# Patient Record
Sex: Female | Born: 1970
Health system: Southern US, Community
[De-identification: ages and names within clinical notes are randomized; demographics above are authoritative.]

## PROBLEM LIST (undated history)

## (undated) DIAGNOSIS — I639 Cerebral infarction, unspecified: Secondary | ICD-10-CM

## (undated) DIAGNOSIS — D259 Leiomyoma of uterus, unspecified: Secondary | ICD-10-CM

## (undated) DIAGNOSIS — I219 Acute myocardial infarction, unspecified: Secondary | ICD-10-CM

## (undated) DIAGNOSIS — I10 Essential (primary) hypertension: Secondary | ICD-10-CM

## (undated) DIAGNOSIS — S83207A Unspecified tear of unspecified meniscus, current injury, left knee, initial encounter: Secondary | ICD-10-CM

## (undated) DIAGNOSIS — Z789 Other specified health status: Secondary | ICD-10-CM

## (undated) DIAGNOSIS — R5381 Other malaise: Secondary | ICD-10-CM

## (undated) DIAGNOSIS — N329 Bladder disorder, unspecified: Secondary | ICD-10-CM

## (undated) DIAGNOSIS — F32A Depression, unspecified: Secondary | ICD-10-CM

## (undated) DIAGNOSIS — K5900 Constipation, unspecified: Secondary | ICD-10-CM

## (undated) DIAGNOSIS — E7800 Pure hypercholesterolemia, unspecified: Secondary | ICD-10-CM

## (undated) DIAGNOSIS — Z9889 Other specified postprocedural states: Secondary | ICD-10-CM

## (undated) DIAGNOSIS — J45909 Unspecified asthma, uncomplicated: Secondary | ICD-10-CM

## (undated) DIAGNOSIS — E119 Type 2 diabetes mellitus without complications: Secondary | ICD-10-CM

## (undated) DIAGNOSIS — N189 Chronic kidney disease, unspecified: Secondary | ICD-10-CM

## (undated) DIAGNOSIS — R519 Headache, unspecified: Secondary | ICD-10-CM

## (undated) DIAGNOSIS — K8020 Calculus of gallbladder without cholecystitis without obstruction: Secondary | ICD-10-CM

## (undated) HISTORY — PX: DILATION AND CURETTAGE OF UTERUS: SHX78

## (undated) HISTORY — PX: CHOLECYSTECTOMY: SHX55

## (undated) HISTORY — PX: TUBAL LIGATION: SHX77

## (undated) HISTORY — DX: Unspecified asthma, uncomplicated: J45.909

## (undated) HISTORY — PX: MYOMECTOMY: SHX85

## (undated) HISTORY — PX: ECTOPIC PREGNANCY SURGERY: SHX613

---
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
## 1998-08-27 HISTORY — PX: FOOT SURGERY: SHX648

## 1999-08-28 HISTORY — PX: ECTOPIC PREGNANCY SURGERY: SHX613

## 2010-08-03 ENCOUNTER — Inpatient Hospital Stay (HOSPITAL_COMMUNITY)
Admission: AD | Admit: 2010-08-03 | Discharge: 2010-08-03 | Payer: Self-pay | Source: Home / Self Care | Admitting: Obstetrics and Gynecology

## 2010-11-07 LAB — URINALYSIS, ROUTINE W REFLEX MICROSCOPIC
Bilirubin Urine: NEGATIVE
Glucose, UA: NEGATIVE mg/dL
Ketones, ur: NEGATIVE mg/dL
Leukocytes, UA: NEGATIVE
Nitrite: POSITIVE — AB
Protein, ur: NEGATIVE mg/dL

## 2010-11-07 LAB — URINE CULTURE

## 2010-11-07 LAB — DIFFERENTIAL
Eosinophils Relative: 3 % (ref 0–5)
Monocytes Absolute: 0.6 10*3/uL (ref 0.1–1.0)
Neutrophils Relative %: 52 % (ref 43–77)

## 2010-11-07 LAB — CBC
HCT: 37.3 % (ref 36.0–46.0)
MCH: 27.8 pg (ref 26.0–34.0)
MCHC: 33.3 g/dL (ref 30.0–36.0)
MCV: 83.6 fL (ref 78.0–100.0)
Platelets: 279 10*3/uL (ref 150–400)

## 2010-11-07 LAB — URINE MICROSCOPIC-ADD ON

## 2010-11-07 LAB — WET PREP, GENITAL
Clue Cells Wet Prep HPF POC: NONE SEEN
Yeast Wet Prep HPF POC: NONE SEEN

## 2011-01-15 ENCOUNTER — Emergency Department (HOSPITAL_COMMUNITY)
Admission: EM | Admit: 2011-01-15 | Discharge: 2011-01-15 | Discharge status: Against Medical Advice | Payer: Self-pay | Attending: Emergency Medicine | Admitting: Emergency Medicine

## 2011-01-15 DIAGNOSIS — S90569A Insect bite (nonvenomous), unspecified ankle, initial encounter: Secondary | ICD-10-CM | POA: Insufficient documentation

## 2011-01-15 DIAGNOSIS — W57XXXA Bitten or stung by nonvenomous insect and other nonvenomous arthropods, initial encounter: Secondary | ICD-10-CM | POA: Insufficient documentation

## 2011-01-15 DIAGNOSIS — M79609 Pain in unspecified limb: Secondary | ICD-10-CM | POA: Insufficient documentation

## 2011-04-04 ENCOUNTER — Inpatient Hospital Stay (INDEPENDENT_AMBULATORY_CARE_PROVIDER_SITE_OTHER)
Admission: RE | Admit: 2011-04-04 | Discharge: 2011-04-04 | Disposition: A | Discharge status: Home / Self Care | Payer: Self-pay | Source: Ambulatory Visit | Attending: Family Medicine | Admitting: Family Medicine

## 2011-04-04 DIAGNOSIS — T6391XA Toxic effect of contact with unspecified venomous animal, accidental (unintentional), initial encounter: Secondary | ICD-10-CM

## 2012-09-14 ENCOUNTER — Emergency Department (HOSPITAL_COMMUNITY): Payer: Self-pay

## 2012-09-14 ENCOUNTER — Emergency Department (HOSPITAL_COMMUNITY)
Admission: EM | Admit: 2012-09-14 | Discharge: 2012-09-15 | Disposition: A | Discharge status: Home / Self Care | Payer: Self-pay | Attending: Emergency Medicine | Admitting: Emergency Medicine

## 2012-09-14 ENCOUNTER — Encounter (HOSPITAL_COMMUNITY): Payer: Self-pay | Admitting: *Deleted

## 2012-09-14 DIAGNOSIS — R069 Unspecified abnormalities of breathing: Secondary | ICD-10-CM | POA: Insufficient documentation

## 2012-09-14 DIAGNOSIS — R0989 Other specified symptoms and signs involving the circulatory and respiratory systems: Secondary | ICD-10-CM | POA: Insufficient documentation

## 2012-09-14 DIAGNOSIS — R05 Cough: Secondary | ICD-10-CM | POA: Insufficient documentation

## 2012-09-14 DIAGNOSIS — R197 Diarrhea, unspecified: Secondary | ICD-10-CM | POA: Insufficient documentation

## 2012-09-14 DIAGNOSIS — R6889 Other general symptoms and signs: Secondary | ICD-10-CM

## 2012-09-14 DIAGNOSIS — J029 Acute pharyngitis, unspecified: Secondary | ICD-10-CM | POA: Insufficient documentation

## 2012-09-14 DIAGNOSIS — IMO0001 Reserved for inherently not codable concepts without codable children: Secondary | ICD-10-CM | POA: Insufficient documentation

## 2012-09-14 DIAGNOSIS — R0602 Shortness of breath: Secondary | ICD-10-CM | POA: Insufficient documentation

## 2012-09-14 DIAGNOSIS — J3489 Other specified disorders of nose and nasal sinuses: Secondary | ICD-10-CM | POA: Insufficient documentation

## 2012-09-14 DIAGNOSIS — R059 Cough, unspecified: Secondary | ICD-10-CM | POA: Insufficient documentation

## 2012-09-14 DIAGNOSIS — I10 Essential (primary) hypertension: Secondary | ICD-10-CM | POA: Insufficient documentation

## 2012-09-14 DIAGNOSIS — R112 Nausea with vomiting, unspecified: Secondary | ICD-10-CM | POA: Insufficient documentation

## 2012-09-14 DIAGNOSIS — Z87891 Personal history of nicotine dependence: Secondary | ICD-10-CM | POA: Insufficient documentation

## 2012-09-14 DIAGNOSIS — R0789 Other chest pain: Secondary | ICD-10-CM | POA: Insufficient documentation

## 2012-09-14 DIAGNOSIS — J069 Acute upper respiratory infection, unspecified: Secondary | ICD-10-CM | POA: Insufficient documentation

## 2012-09-14 HISTORY — DX: Essential (primary) hypertension: I10

## 2012-09-14 HISTORY — DX: Type 2 diabetes mellitus without complications: E11.9

## 2012-09-14 MED ORDER — IPRATROPIUM BROMIDE 0.03 % NA SOLN: 2.0000 | Freq: Two times a day (BID) | NASAL | Status: DC

## 2012-09-14 MED ORDER — IBUPROFEN 800 MG PO TABS
800.0000 mg | ORAL_TABLET | Freq: Once | ORAL | Status: AC
  Administered 2012-09-14: 800 mg via ORAL
  Filled 2012-09-14: qty 1

## 2012-09-14 MED ORDER — AZITHROMYCIN 250 MG PO TABS: 250.0000 mg | ORAL_TABLET | Freq: Every day | ORAL | Status: DC

## 2012-09-14 MED ORDER — IPRATROPIUM BROMIDE 0.02 % IN SOLN
0.5000 mg | Freq: Once | RESPIRATORY_TRACT | Status: AC
  Administered 2012-09-14: 0.5 mg via RESPIRATORY_TRACT
  Filled 2012-09-14: qty 2.5

## 2012-09-14 MED ORDER — BENZONATATE 100 MG PO CAPS: 200.0000 mg | ORAL_CAPSULE | Freq: Two times a day (BID) | ORAL | Status: DC | PRN

## 2012-09-14 MED ORDER — GUAIFENESIN ER 600 MG PO TB12: 1200.0000 mg | ORAL_TABLET | Freq: Two times a day (BID) | ORAL | Status: DC

## 2012-09-14 MED ORDER — ALBUTEROL SULFATE (5 MG/ML) 0.5% IN NEBU
5.0000 mg | INHALATION_SOLUTION | Freq: Once | RESPIRATORY_TRACT | Status: AC
  Administered 2012-09-14: 5 mg via RESPIRATORY_TRACT
  Filled 2012-09-14: qty 1

## 2012-09-14 NOTE — ED Provider Notes (Signed)
History  This chart was scribed for non-physician practitioner working with Victoria [name], by Marlin Canary, ED Scribe. This patient was seen in room WTR5/WTR5 and the patient's care was started at 2200.  CSN: [id number]  Arrival date & time 09/14/12  2113   First MD Initiated Contact with Patient 09/14/12 2200      Chief Complaint  Patient presents with  . Influenza   The history is provided by the patient. No language interpreter was used.   Victoria [name] is a 42 y.o. female who presents to the Emergency Department complaining of gradual onset, gradual worsening, constant influenza symptoms that began 1 week ago. Symptoms include chills, fever, emesis, cough productive of yellow mucus, sore throat, SOB, chest tightness, nasal congestion, and post nasal drip. Pt reports that she had diarrhea 2 days ago but denies having diarrhea currently. She denies any CP, abdominal pain, otalgia and neck pain. The patient's symptoms are exacerbated by stress from 3 recent deaths in the past week. The patient reports taking Theraflu, Vick's and sudafed x2 daily with no relief for symptoms. She denies having a influenza vaccine this year. Pertinent medical history includes a history of HTN and diabetes. Patient adds that she has not taken medication for either since being laid off. She states that she was taking metformin and lisinopril.   Past Medical History  Diagnosis Date  . Hypertension   . Diabetes mellitus without complication     History reviewed. No pertinent past surgical history.  History reviewed. No pertinent family history.  History  Substance Use Topics  . Smoking status: Former Smoker    Quit date: 08/14/2012  . Smokeless tobacco: Not on file  . Alcohol Use: No    No OB history provided.  Review of Systems  Constitutional: Positive for fever and chills. Negative for diaphoresis, appetite change, fatigue and unexpected weight change.  HENT: Positive for congestion,  sore throat and postnasal drip. Negative for mouth sores and neck stiffness.   Eyes: Negative for visual disturbance.  Respiratory: Positive for cough, chest tightness and shortness of breath. Negative for wheezing.   Cardiovascular: Negative for chest pain.  Gastrointestinal: Positive for nausea, vomiting and diarrhea. Negative for abdominal pain and constipation.  Genitourinary: Negative for dysuria, urgency, frequency and hematuria.  Musculoskeletal: Positive for myalgias. Negative for back pain.  Skin: Negative for rash.  Neurological: Negative for syncope, light-headedness and headaches.  Hematological: Does not bruise/bleed easily.  Psychiatric/Behavioral: Negative for sleep disturbance. The patient is not nervous/anxious.   All other systems reviewed and are negative.    Allergies  Codeine and Penicillins  Home Medications   Current Outpatient Rx  Name  Route  Sig  Dispense  Refill  . THERAFLU FLU/CHEST CONGESTION PO   Oral   Take 15 mLs by mouth 2 (two) times daily.         . AZITHROMYCIN 250 MG PO TABS   Oral   Take 1 tablet (250 mg total) by mouth daily. Take first 2 tablets together, then 1 every day until finished.   6 tablet   0   . BENZONATATE 100 MG PO CAPS   Oral   Take 2 capsules (200 mg total) by mouth 2 (two) times daily as needed for cough.   20 capsule   0   . GUAIFENESIN ER 600 MG PO TB12   Oral   Take 2 tablets (1,200 mg total) by mouth 2 (two) times daily.   30 tablet   0   .  IPRATROPIUM BROMIDE 0.03 % NA SOLN   Nasal   Place 2 sprays into the nose 2 (two) times daily. PRN congestion   30 mL   0     BP 165/110  Pulse 92  Temp 99.4 F (37.4 C) (Oral)  Resp 20  SpO2 100%  LMP 08/31/2012  Physical Exam  Nursing note and vitals reviewed. Constitutional: She is oriented to person, place, and time. She appears well-developed and well-nourished. No distress.  HENT:  Head: Normocephalic and atraumatic.  Right Ear: External ear normal.   Left Ear: Tympanic membrane, external ear and ear canal normal.  Nose: Mucosal edema and rhinorrhea present. Right sinus exhibits no maxillary sinus tenderness and no frontal sinus tenderness. Left sinus exhibits no maxillary sinus tenderness and no frontal sinus tenderness.  Mouth/Throat: Uvula is midline. Mucous membranes are not dry and not cyanotic. Posterior oropharyngeal erythema present. No oropharyngeal exudate, posterior oropharyngeal edema or tonsillar abscesses.       Left ear canal is clear, Cerumen in the right ear Left TM is normal, no erythema, no bulging Mild erythema of the oropharynx, no exudate or edema Edema of the nose  Eyes: Conjunctivae normal and EOM are normal. Pupils are equal, round, and reactive to light. No scleral icterus.  Neck: Normal range of motion. Neck supple.  Cardiovascular: Normal rate, regular rhythm, normal heart sounds and intact distal pulses.  Exam reveals no gallop and no friction rub.   No murmur heard. Pulmonary/Chest: Effort normal and breath sounds normal. No respiratory distress. She has no wheezes. She has no rales. She exhibits no tenderness.       Lung sounds are significantly diminished in all fields  Abdominal: Soft. Bowel sounds are normal. She exhibits no distension and no mass. There is no tenderness. There is no rebound and no guarding.  Musculoskeletal: Normal range of motion. She exhibits no edema.  Lymphadenopathy:    She has no cervical adenopathy.  Neurological: She is alert and oriented to person, place, and time. She exhibits normal muscle tone. Coordination normal.       Speech is clear and goal oriented Moves extremities without ataxia  Skin: Skin is warm and dry. No rash noted. She is not diaphoretic. No erythema.  Psychiatric: She has a normal mood and affect.    ED Course  Procedures (including critical care time)  DIAGNOSTIC STUDIES: Oxygen Saturation is 100% on room air, Normal by my interpretation.     COORDINATION OF CARE: 2210-Patient / Family / Caregiver informed of clinical course, understand medical decision-making process, and agree with plan.  Labs Reviewed - No data to display Dg Chest 2 View  09/14/2012  *RADIOLOGY REPORT*  Clinical Data: Cough and congestion  CHEST - 2 VIEW  Comparison: None.  Findings: Lungs clear.  Heart size and pulmonary vascularity are normal.  No adenopathy.  No bone lesions.   IMPRESSION: No abnormality noted.   Original Report Authenticated By: Bretta Bang, M.D.      1. Flu-like symptoms   2. Viral URI with cough       MDM  Tiffany [name] presents with URI symptoms.  Patient with significantly decreased tidal volume on exam. Albuterol/Atrovent nebulizer given with increase in tidal volume and subjective decrease in difficulty breathing. On reevaluation patient with coarse lung sounds throughout. Pt CXR negative for acute infiltrate. Patients symptoms are consistent with URI, likely viral etiology. Patient with uncontrolled diabetes and hypertension. Due to his risk factors the length of her symptoms will  discharge with azithromycin antibiotic. Pt will also be discharged with symptomatic treatment.  I discussed with the patient the need for regular followup for her diabetes and hypertension provided her with a [facility] option for primary care followup. Verbalizes understanding and is agreeable with plan. Pt is hemodynamically stable & in NAD prior to dc.  1. Medications: Atrovent, Mucinex, Tessalon, azithromycin, usual home medications 2. Treatment: rest, drink plenty of fluids, take medications as prescribed 3. Follow Up: Please followup with your primary doctor for discussion of your diagnoses and further evaluation after today's visit; use the phone number provided in the referral section cassette up an appointment with a primary care physician who can help you manage her high blood pressure and diabetes.  Return to the emergency  department if your symptoms worsen.  I personally performed the services described in this documentation, which was scribed in my presence. The recorded information has been reviewed and is accurate.    Dahlia Client Breaunna Gottlieb, PA-C 09/15/12 [phone]

## 2012-09-14 NOTE — ED Notes (Signed)
Pt ambulatory to exam room with steady gait.  

## 2012-09-14 NOTE — ED Notes (Signed)
RT called for breathing tx. 

## 2012-09-14 NOTE — ED Notes (Signed)
Patient transported to X-ray 

## 2012-09-14 NOTE — ED Notes (Signed)
Pt c/o flu sxs since Friday. Pt c/o n/v, fever, generalized aches, cough.

## 2012-09-15 MED ORDER — ALBUTEROL SULFATE HFA 108 (90 BASE) MCG/ACT IN AERS
2.0000 | INHALATION_SPRAY | RESPIRATORY_TRACT | Status: DC | PRN
  Administered 2012-09-15: 2 via RESPIRATORY_TRACT
  Filled 2012-09-15: qty 6.7

## 2012-09-17 NOTE — ED Provider Notes (Signed)
Medical screening examination/treatment/procedure(s) were performed by non-physician practitioner and as supervising physician I was immediately available for consultation/collaboration.   Islam Villescas J. Axten Pascucci, MD 09/17/12 0728 

## 2013-01-20 ENCOUNTER — Emergency Department (HOSPITAL_COMMUNITY)
Admission: EM | Admit: 2013-01-20 | Discharge: 2013-01-20 | Disposition: A | Discharge status: Nursing Home (Includes ICF) | Payer: BC Managed Care – PPO | Source: Home / Self Care | Attending: Emergency Medicine | Admitting: Emergency Medicine

## 2013-01-20 ENCOUNTER — Emergency Department (HOSPITAL_COMMUNITY): Payer: BC Managed Care – PPO

## 2013-01-20 ENCOUNTER — Encounter (HOSPITAL_COMMUNITY): Payer: Self-pay | Admitting: Emergency Medicine

## 2013-01-20 ENCOUNTER — Emergency Department (HOSPITAL_COMMUNITY)
Admission: EM | Admit: 2013-01-20 | Discharge: 2013-01-20 | Disposition: A | Discharge status: Home / Self Care | Payer: BC Managed Care – PPO | Attending: Emergency Medicine | Admitting: Emergency Medicine

## 2013-01-20 DIAGNOSIS — I10 Essential (primary) hypertension: Secondary | ICD-10-CM | POA: Insufficient documentation

## 2013-01-20 DIAGNOSIS — R109 Unspecified abdominal pain: Secondary | ICD-10-CM

## 2013-01-20 DIAGNOSIS — E119 Type 2 diabetes mellitus without complications: Secondary | ICD-10-CM | POA: Insufficient documentation

## 2013-01-20 DIAGNOSIS — K8020 Calculus of gallbladder without cholecystitis without obstruction: Secondary | ICD-10-CM | POA: Insufficient documentation

## 2013-01-20 DIAGNOSIS — Z87891 Personal history of nicotine dependence: Secondary | ICD-10-CM | POA: Insufficient documentation

## 2013-01-20 LAB — CBC WITH DIFFERENTIAL/PLATELET
Eosinophils Relative: 3 % (ref 0–5)
Lymphocytes Relative: 42 % (ref 12–46)
Lymphs Abs: 3.6 10*3/uL (ref 0.7–4.0)
MCH: 27.3 pg (ref 26.0–34.0)
MCV: 79.9 fL (ref 78.0–100.0)
Monocytes Absolute: 0.4 10*3/uL (ref 0.1–1.0)
Neutro Abs: 4.4 10*3/uL (ref 1.7–7.7)
Platelets: 363 10*3/uL (ref 150–400)

## 2013-01-20 LAB — COMPREHENSIVE METABOLIC PANEL
AST: 17 U/L (ref 0–37)
CO2: 24 mEq/L (ref 19–32)
Calcium: 9.6 mg/dL (ref 8.4–10.5)
Creatinine, Ser: 0.84 mg/dL (ref 0.50–1.10)
Glucose, Bld: 88 mg/dL (ref 70–99)
Sodium: 141 mEq/L (ref 135–145)

## 2013-01-20 LAB — POCT URINALYSIS DIP (DEVICE)
Bilirubin Urine: NEGATIVE
Glucose, UA: NEGATIVE mg/dL
Nitrite: POSITIVE — AB
Protein, ur: NEGATIVE mg/dL
Specific Gravity, Urine: >=1.03 (ref 1.005–1.030)
pH: 5.5 (ref 5.0–8.0)

## 2013-01-20 MED ORDER — HYDROCODONE-ACETAMINOPHEN 5-325 MG PO TABS: 1.0000 | ORAL_TABLET | ORAL | Status: DC | PRN

## 2013-01-20 MED ORDER — IOHEXOL 300 MG/ML  SOLN: 100.0000 mL | Freq: Once | INTRAMUSCULAR | Status: DC | PRN

## 2013-01-20 MED ORDER — ONDANSETRON HCL 4 MG PO TABS: 4.0000 mg | ORAL_TABLET | Freq: Four times a day (QID) | ORAL | Status: DC

## 2013-01-20 MED ORDER — IOHEXOL 300 MG/ML  SOLN: 25.0000 mL | INTRAMUSCULAR | Status: AC

## 2013-01-20 MED ORDER — HYDROMORPHONE HCL PF 1 MG/ML IJ SOLN
1.0000 mg | Freq: Once | INTRAMUSCULAR | Status: AC
  Administered 2013-01-20: 1 mg via INTRAVENOUS
  Filled 2013-01-20: qty 1

## 2013-01-20 MED ORDER — IOHEXOL 300 MG/ML  SOLN
100.0000 mL | Freq: Once | INTRAMUSCULAR | Status: AC | PRN
  Administered 2013-01-20: 100 mL via INTRAVENOUS

## 2013-01-20 NOTE — ED Notes (Signed)
Pt remains in ultrasound.  Family waiting in pt's room

## 2013-01-20 NOTE — ED Provider Notes (Signed)
History     CSN: [id number]  Arrival date & time 01/20/13  1241   First MD Initiated Contact with Patient 01/20/13 [phone]      Chief Complaint  Patient presents with  . Abdominal Pain    (Consider location/radiation/quality/duration/timing/severity/associated sxs/prior treatment) HPI  Patient is a 42 year old female past medical history significant for hypertension, DM presenting for severe sudden onset right-sided abdominal pain without radiation that woke her up this morning at 3 AM. Rates pain 10/10. Patient denies nausea, vomiting, fever, chills, diarrhea, urinary symptoms. Patient has history of ectopic pregnancy requiring surgery, no other abdominal or pelvic surgeries. Patient was seen and evaluated at urgent care Center and sent over for further evaluation with concern of abdominal pain.  Past Medical History  Diagnosis Date  . Hypertension   . Diabetes mellitus without complication     Past Surgical History  Procedure Laterality Date  . Foot surgery Left 2000  . Ectopic pregnancy surgery  2001    History reviewed. No pertinent family history.  History  Substance Use Topics  . Smoking status: Former Smoker    Quit date: 08/14/2012  . Smokeless tobacco: Not on file  . Alcohol Use: No    OB History   Grav Para Term Preterm Abortions TAB SAB Ect Mult Living                  Review of Systems  Constitutional: Negative for fever and chills.  HENT: Negative for neck pain.   Eyes: Negative for visual disturbance.  Respiratory: Negative for shortness of breath.   Cardiovascular: Negative for chest pain.  Gastrointestinal: Positive for abdominal pain. Negative for nausea, vomiting, diarrhea, constipation, blood in stool and anal bleeding.  Genitourinary: Negative for dysuria, urgency, flank pain, vaginal bleeding, vaginal discharge and vaginal pain.  Musculoskeletal: Negative for back pain.  Skin: Negative.   Neurological: Negative for headaches.    Allergies   Codeine and Penicillins  Home Medications   Current Outpatient Rx  Name  Route  Sig  Dispense  Refill  . naproxen sodium (ANAPROX) 220 MG tablet   Oral   Take 440 mg by mouth daily as needed (for pain).          Marland Kitchen HYDROcodone-acetaminophen (NORCO/VICODIN) 5-325 MG per tablet   Oral   Take 1 tablet by mouth every 4 (four) hours as needed for pain.   20 tablet   0   . ondansetron (ZOFRAN) 4 MG tablet   Oral   Take 1 tablet (4 mg total) by mouth every 6 (six) hours.   12 tablet   0     BP 158/106  Pulse 95  Temp(Src) 98 F (36.7 C) (Oral)  Resp 18  Ht 5\' 9"  (1.753 m)  Wt 224 lb 11.2 oz (101.923 kg)  BMI 33.17 kg/m2  SpO2 98%  LMP 01/05/2013  Physical Exam  Constitutional: She is oriented to person, place, and time. She appears well-developed and well-nourished.  HENT:  Head: Normocephalic and atraumatic.  Eyes: Conjunctivae are normal.  Cardiovascular: Normal rate, regular rhythm and normal heart sounds.   Pulmonary/Chest: Effort normal and breath sounds normal.  Abdominal: Bowel sounds are normal. There is tenderness in the right upper quadrant and right lower quadrant. There is rebound, guarding and positive Murphy's sign. There is no CVA tenderness.  Neurological: She is alert and oriented to person, place, and time.  Skin: Skin is warm and dry.  Psychiatric: She has a normal mood and affect.  ED Course  Procedures (including critical care time)  Medications  iohexol (OMNIPAQUE) 300 MG/ML solution 25 mL (not administered)  HYDROmorphone (DILAUDID) injection 1 mg (1 mg Intravenous Given 01/20/13 1635)  iohexol (OMNIPAQUE) 300 MG/ML solution 100 mL (100 mLs Intravenous Contrast Given 01/20/13 1850)   Pain relieved with one round of Morphine. Abdominal exam non-surgical on re-examination.    Labs Reviewed  COMPREHENSIVE METABOLIC PANEL - Abnormal; Notable for the following:    Total Protein 8.4 (*)    GFR calc non Af Amer 84 (*)    All other components  within normal limits  CBC WITH DIFFERENTIAL  LIPASE, BLOOD  HCG, QUANTITATIVE, PREGNANCY   US Abdomen Complete  01/20/2013   *RADIOLOGY REPORT*  Clinical Data:  Right upper quadrant abdominal pain.  Hypertension. Diabetes.  COMPLETE ABDOMINAL ULTRASOUND  Comparison:  08/03/2010 pelvic ultrasound.  Findings:  Gallbladder:  Mobile gallstone, 1 cm in diameter.  No gallbladder wall thickening or pericholecystic fluid.  Sonographic Murphy's sign absent.  Common bile duct:  4 mm in diameter, within normal limits.  Liver:  No focal lesion identified.  Within normal limits in parenchymal echogenicity.  IVC:  Appears normal.  Pancreas:  Borderline dilated dorsal pancreatic duct along the pancreatic head and body, 3 mm.  Spleen:  Measures 4.9 cm craniocaudad, unremarkable.  Right Kidney:  Measures 12.4 cm in length.  Similar echogenicity to the adjacent liver.  No definite calculi or other abnormality.  Left Kidney:  Measures 12.1 cm in length.  Difficult to see due to positioning and adjacent ribs.  No definite abnormality.  Abdominal aorta:  No aneurysm identified.  IMPRESSION:  1.  Cholelithiasis, with single mobile gallstone observed. 2.  Borderline dilatation of the dorsal pancreatic duct in the vicinity of the pancreatic head.  No CBD dilatation.  Significance of this finding is uncertain.   Original Report Authenticated By: Gaylyn Rong, M.D.   Ct Abdomen Pelvis W Contrast  01/20/2013   *RADIOLOGY REPORT*  Clinical Data: Right side abdominal pain.  Vomiting.  CT ABDOMEN AND PELVIS WITH CONTRAST  Technique:  Multidetector CT imaging of the abdomen and pelvis was performed following the standard protocol during bolus administration of intravenous contrast.  Contrast: OMNIPAQUE IOHEXOL 300 MG/ML  SOLN  Comparison: Ultrasound 01/20/2013  Findings: Lung bases are clear.  No pericardial fluid.  No focal hepatic lesion.  The gallbladder, pancreas, spleen, adrenal glands, and kidneys are normal. Small 5 mm  gallstone within the gallbladder.  The stomach, small bowel, and appendix are normal.  Colon and rectosigmoid colon are normal.  Abdominal aorta is normal caliber.  No retroperitoneal periportal lymphadenopathy.  No free fluid the pelvis.  Uterus and bladder normal.  No pelvic lymphadenopathy. Review of  bone windows demonstrates no aggressive osseous lesions.  IMPRESSION:  1.  No acute abdominal or pelvic findings. 2.  Normal appendix. 3.  Small gallstone in the gallbladder with no evidence cholecystitis.   Original Report Authenticated By: Genevive Bi, M.D.     1. Cholelithiases       MDM  Patient is nontoxic, nonseptic appearing, in no apparent distress.  Patient's pain and other symptoms adequately managed in emergency department. Labs, imaging and vitals reviewed.  Imaging revealed cholelithiases w/o concern for cholecystitis. Patient does not meet the SIRS or Sepsis criteria.  On repeat exam patient does not have a surgical abdomen and there are nor peritoneal signs.  No indication of appendicitis, bowel obstruction, bowel perforation, cholecystitis, diverticulitis, PID or ectopic pregnancy.  Patient discharged home with symptomatic treatment, pain management, and given strict instructions for follow-up with the general surgeon.  I have also discussed reasons to return immediately to the ER.  Patient expresses understanding and agrees with plan. Patient d/w with Dr. Manus Gunning, agrees with plan. Patient is stable at time of discharge             Jeannetta Ellis, PA-C 01/21/13 1115

## 2013-01-20 NOTE — ED Provider Notes (Signed)
Medical screening examination/treatment/procedure(s) were performed by non-physician practitioner and as supervising physician I was immediately available for consultation/collaboration. Have examined the patient myself agree that abdominal exam consistent with an acute abdominal condition, a bedside nondiagnostic ultrasound was performed it was inconclusive. Patient has been transfer stable to the emergency department for further evaluation.     Raynald Blend, MD 01/20/13 1341

## 2013-01-20 NOTE — ED Notes (Signed)
Bedside u/s in progress.

## 2013-01-20 NOTE — ED Notes (Signed)
Not in treatment room 

## 2013-01-20 NOTE — ED Notes (Signed)
Not ready for transport.  Bedside ultrasound

## 2013-01-20 NOTE — ED Notes (Addendum)
UCC Tx. Patient presents with abdominal pain with no known cause. Starting at 0300 this past am. Urinary frequency with no other urinary Sx, NO n/v/d. Normal BM. Good PO Patient states that urine pregnancy at [facility] was negative, but she has Hx of negative urine tests and being pregnant.

## 2013-01-20 NOTE — ED Notes (Signed)
Abdominal pain woke patient at 3 am.  Reports right side of abdomen hurts.  Reports vomiting for 2 weeks, not as bed.  Reports no vomiting since this pain started.  Reports normal bm last night.  denies urinary pain but goes frequently. Denies vaginal discharge.

## 2013-01-20 NOTE — ED Provider Notes (Signed)
History     CSN: [id number]  Arrival date & time 01/20/13  1031   First MD Initiated Contact with Patient 01/20/13 1139      Chief Complaint  Patient presents with  . Abdominal Pain    (Consider location/radiation/quality/duration/timing/severity/associated sxs/prior treatment) HPI Comments: Pt presents with severe right-sided abdominal pain that awoke her from her sleep at 3 AM this morning.  She states she has had symptoms similar to this before when she was diagnosed with ectopic pregnancy.  She says the pain is constant, severe, and exacerbated by any movement.  She has not had any NVD.    Pt reports a 1 month history of intermittent vomiting and feeling a "hard knot" deep in her RUQ abdomen.  She has not had any abdominal pain associated with this.    Patient is a 42 y.o. female presenting with abdominal pain.  Abdominal Pain Associated symptoms include abdominal pain. Pertinent negatives include no chest pain and no shortness of breath.    Past Medical History  Diagnosis Date  . Hypertension   . Diabetes mellitus without complication     Past Surgical History  Procedure Laterality Date  . Foot surgery Left   . Ectopic pregnancy surgery      No family history on file.  History  Substance Use Topics  . Smoking status: Former Smoker    Quit date: 08/14/2012  . Smokeless tobacco: Not on file  . Alcohol Use: No    OB History   Grav Para Term Preterm Abortions TAB SAB Ect Mult Living                  Review of Systems  Constitutional: Negative for fever and chills.  Eyes: Negative for visual disturbance.  Respiratory: Negative for cough and shortness of breath.   Cardiovascular: Negative for chest pain, palpitations and leg swelling.  Gastrointestinal: Positive for nausea, vomiting (for last month, see HPI ) and abdominal pain.  Endocrine: Negative for polydipsia and polyuria.  Genitourinary: Negative for dysuria, urgency and frequency.  Musculoskeletal:  Negative for myalgias and arthralgias.  Skin: Negative for rash.  Neurological: Negative for dizziness, weakness and light-headedness.    Allergies  Codeine and Penicillins  Home Medications   Current Outpatient Rx  Name  Route  Sig  Dispense  Refill  . naproxen sodium (ANAPROX) 220 MG tablet   Oral   Take 220 mg by mouth 2 (two) times daily with a meal.         . Acetaminophen-Guaifenesin (THERAFLU FLU/CHEST CONGESTION PO)   Oral   Take 15 mLs by mouth 2 (two) times daily.         Marland Kitchen azithromycin (ZITHROMAX) 250 MG tablet   Oral   Take 1 tablet (250 mg total) by mouth daily. Take first 2 tablets together, then 1 every day until finished.   6 tablet   0   . benzonatate (TESSALON) 100 MG capsule   Oral   Take 2 capsules (200 mg total) by mouth 2 (two) times daily as needed for cough.   20 capsule   0   . guaiFENesin (MUCINEX) 600 MG 12 hr tablet   Oral   Take 2 tablets (1,200 mg total) by mouth 2 (two) times daily.   30 tablet   0   . ipratropium (ATROVENT) 0.03 % nasal spray   Nasal   Place 2 sprays into the nose 2 (two) times daily. PRN congestion   30 mL   0  BP 162/101  Pulse 75  Temp(Src) 99.4 F (37.4 C) (Oral)  Resp 18  SpO2 100%  LMP 01/05/2013  Physical Exam  Nursing note and vitals reviewed. Constitutional: She is oriented to person, place, and time. Vital signs are normal. She appears well-developed and well-nourished. No distress.  HENT:  Head: Atraumatic.  Eyes: EOM are normal. Pupils are equal, round, and reactive to light.  Cardiovascular: Normal rate, regular rhythm and normal heart sounds.  Exam reveals no gallop and no friction rub.   No murmur heard. Pulmonary/Chest: Effort normal and breath sounds normal. No respiratory distress. She has no wheezes. She has no rales.  Abdominal: Soft. There is tenderness in the left upper quadrant and left lower quadrant. There is rebound, guarding, tenderness at McBurney's point and positive  Murphy's sign. There is no rigidity.  Neurological: She is alert and oriented to person, place, and time. She has normal strength.  Skin: Skin is warm and dry. She is not diaphoretic.  Psychiatric: She has a normal mood and affect. Her behavior is normal. Judgment normal.    ED Course  Procedures (including critical care time)  Labs Reviewed  POCT URINALYSIS DIP (DEVICE) - Abnormal; Notable for the following:    Hgb urine dipstick LARGE (*)    Nitrite POSITIVE (*)    All other components within normal limits  POCT PREGNANCY, URINE   No results found.   1. Abdominal pain, right lateral       MDM  Pt's exam is significant for possible acute abdomen.  Bedside ultrasound performed by Dr. Ladon Applebaum shows possible cholelithiasis and CBD dilation, and sonographic murphy's sign is present.  Pt is being transferred to ED for further workup        Graylon Good, PA-C 01/20/13 1228

## 2013-01-20 NOTE — ED Notes (Signed)
Pt returned from ultrasound, finished 1 cup of contrast, CT made aware.

## 2013-01-21 NOTE — ED Provider Notes (Signed)
Medical screening examination/treatment/procedure(s) were conducted as a shared visit with non-physician practitioner(s) and myself.  I personally evaluated the patient during the encounter  R upper and lower abdominal pain since 3 am. No assocaited symptoms. TTP RUQ with guarding, tender in RLQ but less so. Cholelithiasis without cholecystitis.  No LFT or lipase elevation. D/w Dr. Harlon Flor who agrees with outpatient followup.  Glynn Octave, MD 01/21/13 1157

## 2013-01-22 ENCOUNTER — Other Ambulatory Visit: Payer: Self-pay

## 2013-01-22 DIAGNOSIS — Z1231 Encounter for screening mammogram for malignant neoplasm of breast: Secondary | ICD-10-CM

## 2013-01-27 ENCOUNTER — Ambulatory Visit: Payer: BC Managed Care – PPO | Admitting: Family Medicine

## 2013-01-29 ENCOUNTER — Ambulatory Visit (INDEPENDENT_AMBULATORY_CARE_PROVIDER_SITE_OTHER): Payer: BC Managed Care – PPO | Admitting: Surgery

## 2013-01-29 ENCOUNTER — Encounter (INDEPENDENT_AMBULATORY_CARE_PROVIDER_SITE_OTHER): Payer: Self-pay | Admitting: Surgery

## 2013-01-29 VITALS — BP 112/72 | HR 98 | Temp 97.3°F | Resp 16 | Ht 68.5 in | Wt 221.0 lb

## 2013-01-29 DIAGNOSIS — K8010 Calculus of gallbladder with chronic cholecystitis without obstruction: Secondary | ICD-10-CM

## 2013-01-29 NOTE — Progress Notes (Signed)
Patient ID: Victoria [name], female   DOB: 03/17/1971, 42 y.o.   MRN: [id number]  Chief Complaint  Patient presents with  . New Evaluation    eval GB    HPI Victoria [name] is a 42 y.o. female.  Referred by Dr. Bland for evaluation of gallbladder disease  HPI This is a 42-year-old female who presents after recent trip to the emergency department for severe right-sided abdominal pain associated with nausea, vomiting, and abdominal bloating.  She was evaluated and was noted to have a 1 cm gallstone. Her liver function tests were normal. She reports about a year of intermittent "indigestion" with postprandial upper abdominal pain and bloating. This most recent episode was by far the worst. She presents today to discuss cholecystectomy  Past Medical History  Diagnosis Date  . Hypertension   . Diabetes mellitus without complication   . Asthma     Past Surgical History  Procedure Laterality Date  . Foot surgery Left 2000  . Ectopic pregnancy surgery  2001    Family History  Problem Relation Age of Onset  . Cancer Paternal Grandmother     breast  . Cancer Paternal Grandfather     lung    Social History History  Substance Use Topics  . Smoking status: Former Smoker    Quit date: 08/14/2012  . Smokeless tobacco: Never Used  . Alcohol Use: No    Allergies  Allergen Reactions  . Codeine Other (See Comments)    "messes with my heartbeat"  . Penicillins Other (See Comments)    "messes with my heartbeat"    Current Outpatient Prescriptions  Medication Sig Dispense Refill  . HYDROcodone-acetaminophen (NORCO/VICODIN) 5-325 MG per tablet Take 1 tablet by mouth every 4 (four) hours as needed for pain.  20 tablet  0  . naproxen sodium (ANAPROX) 220 MG tablet Take 440 mg by mouth daily as needed (for pain).       . Olmesartan-Amlodipine-HCTZ (TRIBENZOR) 40-10-25 MG TABS Take by mouth.      . ondansetron (ZOFRAN) 4 MG tablet Take 1 tablet (4 mg total) by mouth every 6 (six) hours.  12  tablet  0   No current facility-administered medications for this visit.    Review of Systems Review of Systems  Constitutional: Negative for fever, chills and unexpected weight change.  HENT: Negative for hearing loss, congestion, sore throat, trouble swallowing and voice change.   Eyes: Negative for visual disturbance.  Respiratory: Negative for cough and wheezing.   Cardiovascular: Positive for palpitations. Negative for chest pain and leg swelling.  Gastrointestinal: Positive for nausea, abdominal pain and abdominal distention. Negative for vomiting, diarrhea, constipation, blood in stool and anal bleeding.  Genitourinary: Negative for hematuria, vaginal bleeding and difficulty urinating.  Musculoskeletal: Negative for arthralgias.  Skin: Negative for rash and wound.  Neurological: Positive for weakness and headaches. Negative for seizures and syncope.  Hematological: Negative for adenopathy. Does not bruise/bleed easily.  Psychiatric/Behavioral: Negative for confusion.    Blood pressure 112/72, pulse 98, temperature 97.3 F (36.3 C), temperature source Temporal, resp. rate 16, height 5' 8.5" (1.74 m), weight 221 lb (100.245 kg), last menstrual period 01/05/2013.  Physical Exam Physical Exam WDWN in NAD HEENT:  EOMI, sclera anicteric Neck:  No masses, no thyromegaly Lungs:  CTA bilaterally; normal respiratory effort CV:  Regular rate and rhythm; no murmurs Abd:  +bowel sounds, obese; mildly distended; tender in RUQ/ epigastrium Ext:  Well-perfused; no edema Skin:  Warm, dry; no sign of jaundice    Data Reviewed Lab Results  Component Value Date   WBC 8.7 01/20/2013   HGB 13.7 01/20/2013   HCT 40.1 01/20/2013   MCV 79.9 01/20/2013   PLT 363 01/20/2013   Lab Results  Component Value Date   CREATININE 0.84 01/20/2013   BUN 13 01/20/2013   NA 141 01/20/2013   K 4.0 01/20/2013   CL 106 01/20/2013   CO2 24 01/20/2013   Lab Results  Component Value Date   ALT 11 01/20/2013    AST 17 01/20/2013   ALKPHOS 76 01/20/2013   BILITOT 0.3 01/20/2013   Lab Results  Component Value Date   LIPASE 15 01/20/2013    COMPLETE ABDOMINAL ULTRASOUND  Comparison: 08/03/2010 pelvic ultrasound.  Findings:  Gallbladder: Mobile gallstone, 1 cm in diameter. No gallbladder  wall thickening or pericholecystic fluid. Sonographic Murphy's  sign absent.  Common bile duct: 4 mm in diameter, within normal limits.  Liver: No focal lesion identified. Within normal limits in  parenchymal echogenicity.  IVC: Appears normal.  Pancreas: Borderline dilated dorsal pancreatic duct along the  pancreatic head and body, 3 mm.  Spleen: Measures 4.9 cm craniocaudad, unremarkable.  Right Kidney: Measures 12.4 cm in length. Similar echogenicity to  the adjacent liver. No definite calculi or other abnormality.  Left Kidney: Measures 12.1 cm in length. Difficult to see due to  positioning and adjacent ribs. No definite abnormality.  Abdominal aorta: No aneurysm identified.  IMPRESSION:  1. Cholelithiasis, with single mobile gallstone observed.  2. Borderline dilatation of the dorsal pancreatic duct in the  vicinity of the pancreatic head. No CBD dilatation. Significance  of this finding is uncertain.  Original Report Authenticated By: Walter Liebkemann, M.D.   CT ABDOMEN AND PELVIS WITH CONTRAST  Technique: Multidetector CT imaging of the abdomen and pelvis was  performed following the standard protocol during bolus  administration of intravenous contrast.  Contrast: 100mL OMNIPAQUE IOHEXOL 300 MG/ML SOLN  Comparison: Ultrasound 01/20/2013  Findings: Lung bases are clear. No pericardial fluid.  No focal hepatic lesion. The gallbladder, pancreas, spleen,  adrenal glands, and kidneys are normal. Small 5 mm gallstone within  the gallbladder.  The stomach, small bowel, and appendix are normal. Colon and  rectosigmoid colon are normal.  Abdominal aorta is normal caliber. No retroperitoneal  periportal  lymphadenopathy.  No free fluid the pelvis. Uterus and bladder normal. No pelvic  lymphadenopathy. Review of bone windows demonstrates no aggressive  osseous lesions.  IMPRESSION:  1. No acute abdominal or pelvic findings.  2. Normal appendix.  3. Small gallstone in the gallbladder with no evidence  cholecystitis.  Original Report Authenticated By: Stewart Edmunds, M.D.         Assessment    Chronic calculus cholecystitis    Plan    Laparoscopic cholecystectomy with intraoperative cholangiogram. The surgical procedure has been discussed with the patient.  Potential risks, benefits, alternative treatments, and expected outcomes have been explained.  All of the patient's questions at this time have been answered.  The likelihood of reaching the patient's treatment goal is good.  The patient understand the proposed surgical procedure and wishes to proceed.         Nidya Bouyer K. 01/29/2013, 5:10 PM    

## 2013-02-04 ENCOUNTER — Encounter (HOSPITAL_COMMUNITY): Payer: Self-pay | Admitting: Pharmacy Technician

## 2013-02-05 ENCOUNTER — Encounter (HOSPITAL_COMMUNITY)
Admission: RE | Admit: 2013-02-05 | Discharge: 2013-02-05 | Disposition: A | Discharge status: Home / Self Care | Payer: BC Managed Care – PPO | Source: Ambulatory Visit | Attending: Surgery | Admitting: Surgery

## 2013-02-05 ENCOUNTER — Encounter (HOSPITAL_COMMUNITY): Payer: Self-pay

## 2013-02-05 DIAGNOSIS — Z01812 Encounter for preprocedural laboratory examination: Secondary | ICD-10-CM | POA: Insufficient documentation

## 2013-02-05 DIAGNOSIS — Z789 Other specified health status: Secondary | ICD-10-CM

## 2013-02-05 HISTORY — DX: Other specified health status: Z78.9

## 2013-02-05 HISTORY — DX: Calculus of gallbladder without cholecystitis without obstruction: K80.20

## 2013-02-05 LAB — SURGICAL PCR SCREEN
MRSA, PCR: NEGATIVE
Staphylococcus aureus: POSITIVE — AB

## 2013-02-05 NOTE — Pre-Procedure Instructions (Addendum)
EKG 01-28-13, Labs 01-28-13 reports- CMP,CBC/d,LP,TSH,T4,T3-with chart Dr. Parke Simmers. 02-05-13 Pt. Cautioned body piercing metals "could cause skin burns or infection"  02-05-13 1555 Voice message left to inform pt. Of Positive Staph aureus PCR screen-ask to return call to confirm message received. Victoria [name]

## 2013-02-05 NOTE — Patient Instructions (Addendum)
[address]  02/05/2013   Your procedure is scheduled on:   02-16-2013  Report to [facility] at   1100     AM .  Call this number if you have problems the morning of surgery: [phone]  Or Presurgical Testing [phone](Taygen Newsome)      Do not eat food:After Midnight.  May have clear liquids:up to 6 Hours before arrival. Nothing after :0700 am  Clear liquids include soda, tea, black coffee, apple or grape juice, broth.  Take these medicines the morning of surgery with A SIP OF WATER: Hydrocodone. Zofran. On arrival we will give Amlodipine 5 mg orally(instead of TriBenzor)   Do not wear jewelry, make-up or nail polish.  Do not wear lotions, powders, or perfumes. You may wear deodorant.  Do not shave 12 hours prior to first CHG shower(legs and under arms).(face and neck okay.)  Do not bring valuables to the hospital.  Contacts, dentures or bridgework,body piercing,  may not be worn into surgery.  Leave suitcase in the car. After surgery it may be brought to your room.  For patients admitted to the hospital, checkout time is 11:00 AM the day of discharge.   Patients discharged the day of surgery will not be allowed to drive home. Must have responsible person with you x 24 hours once discharged.  Name and phone number of your driver: Chalmers Guest, boyfriend- [phone] cell  Special Instructions: CHG(Chlorhedine 4%-"Hibiclens","Betasept","Aplicare") Education officer, community Wash: see special instructions.(avoid face and genitals)   Please read over the following fact sheets that you were given: MRSA Information.    Failure to follow these instructions may result in Cancellation of your surgery.   Patient signature_______________________________________________________

## 2013-02-06 NOTE — Pre-Procedure Instructions (Signed)
SPOKE WITH PT BY PHONE - SHE WILL FILL RX FOR MUPIROCIN OINTMENT AND FOLLOW INSTRUCTIONS TO USE IN HER NOSE TWICE A DAY FOR 5 DAYS BECAUSE OF NASAL SWAB POSITIVE FOR STAPH AUREUS.  PAT [facility] RN

## 2013-02-16 ENCOUNTER — Ambulatory Visit (HOSPITAL_COMMUNITY)
Admission: RE | Admit: 2013-02-16 | Discharge: 2013-02-16 | Disposition: A | Discharge status: Home / Self Care | Payer: BC Managed Care – PPO | Source: Ambulatory Visit | Attending: Surgery | Admitting: Surgery

## 2013-02-16 ENCOUNTER — Encounter (HOSPITAL_COMMUNITY): Payer: Self-pay | Admitting: *Deleted

## 2013-02-16 ENCOUNTER — Ambulatory Visit (HOSPITAL_COMMUNITY): Payer: BC Managed Care – PPO

## 2013-02-16 ENCOUNTER — Encounter (HOSPITAL_COMMUNITY)
Admission: RE | Disposition: A | Discharge status: Home / Self Care | Payer: Self-pay | Source: Ambulatory Visit | Attending: Surgery

## 2013-02-16 ENCOUNTER — Encounter (HOSPITAL_COMMUNITY): Payer: Self-pay | Admitting: Anesthesiology

## 2013-02-16 ENCOUNTER — Ambulatory Visit (HOSPITAL_COMMUNITY): Payer: BC Managed Care – PPO | Admitting: Anesthesiology

## 2013-02-16 DIAGNOSIS — K8010 Calculus of gallbladder with chronic cholecystitis without obstruction: Secondary | ICD-10-CM

## 2013-02-16 DIAGNOSIS — E119 Type 2 diabetes mellitus without complications: Secondary | ICD-10-CM | POA: Insufficient documentation

## 2013-02-16 DIAGNOSIS — I10 Essential (primary) hypertension: Secondary | ICD-10-CM | POA: Insufficient documentation

## 2013-02-16 HISTORY — DX: Pure hypercholesterolemia, unspecified: E78.00

## 2013-02-16 HISTORY — PX: CHOLECYSTECTOMY: SHX55

## 2013-02-16 LAB — GLUCOSE, CAPILLARY: Glucose-Capillary: 98 mg/dL (ref 70–99)

## 2013-02-16 SURGERY — LAPAROSCOPIC CHOLECYSTECTOMY WITH INTRAOPERATIVE CHOLANGIOGRAM
Anesthesia: General | Site: Abdomen | Wound class: Clean Contaminated

## 2013-02-16 MED ORDER — PROMETHAZINE HCL 25 MG/ML IJ SOLN: 6.2500 mg | INTRAMUSCULAR | Status: DC | PRN

## 2013-02-16 MED ORDER — KETOROLAC TROMETHAMINE 30 MG/ML IJ SOLN
15.0000 mg | Freq: Once | INTRAMUSCULAR | Status: AC | PRN
  Administered 2013-02-16: 30 mg via INTRAVENOUS

## 2013-02-16 MED ORDER — ONDANSETRON HCL 4 MG/2ML IJ SOLN
INTRAMUSCULAR | Status: DC | PRN
  Administered 2013-02-16: 4 mg via INTRAVENOUS

## 2013-02-16 MED ORDER — KETOROLAC TROMETHAMINE 30 MG/ML IJ SOLN
INTRAMUSCULAR | Status: AC
  Filled 2013-02-16: qty 1

## 2013-02-16 MED ORDER — IOHEXOL 300 MG/ML  SOLN
INTRAMUSCULAR | Status: AC
  Filled 2013-02-16: qty 1

## 2013-02-16 MED ORDER — BUPIVACAINE-EPINEPHRINE 0.25% -1:200000 IJ SOLN
INTRAMUSCULAR | Status: AC
  Filled 2013-02-16: qty 1

## 2013-02-16 MED ORDER — LACTATED RINGERS IV SOLN
INTRAVENOUS | Status: DC
  Administered 2013-02-16 (×2): via INTRAVENOUS

## 2013-02-16 MED ORDER — CIPROFLOXACIN IN D5W 400 MG/200ML IV SOLN
INTRAVENOUS | Status: AC
  Filled 2013-02-16: qty 200

## 2013-02-16 MED ORDER — HYDROMORPHONE HCL PF 1 MG/ML IJ SOLN
0.2500 mg | INTRAMUSCULAR | Status: DC | PRN
  Administered 2013-02-16: 0.25 mg via INTRAVENOUS

## 2013-02-16 MED ORDER — LIDOCAINE HCL (CARDIAC) 20 MG/ML IV SOLN
INTRAVENOUS | Status: DC | PRN
  Administered 2013-02-16: 60 mg via INTRAVENOUS

## 2013-02-16 MED ORDER — LACTATED RINGERS IV SOLN: INTRAVENOUS | Status: DC | PRN

## 2013-02-16 MED ORDER — FENTANYL CITRATE 0.05 MG/ML IJ SOLN
INTRAMUSCULAR | Status: DC | PRN
  Administered 2013-02-16: 100 ug via INTRAVENOUS
  Administered 2013-02-16: 50 ug via INTRAVENOUS
  Administered 2013-02-16: 100 ug via INTRAVENOUS

## 2013-02-16 MED ORDER — OXYCODONE-ACETAMINOPHEN 5-325 MG PO TABS
1.0000 | ORAL_TABLET | ORAL | Status: DC | PRN
  Administered 2013-02-16: 1 via ORAL
  Filled 2013-02-16: qty 1

## 2013-02-16 MED ORDER — SODIUM CHLORIDE 0.9 % IV SOLN
INTRAVENOUS | Status: DC | PRN
  Administered 2013-02-16: 14:00:00

## 2013-02-16 MED ORDER — GLYCOPYRROLATE 0.2 MG/ML IJ SOLN
INTRAMUSCULAR | Status: DC | PRN
  Administered 2013-02-16: 0.6 mg via INTRAVENOUS

## 2013-02-16 MED ORDER — CHLORHEXIDINE GLUCONATE 4 % EX LIQD
1.0000 "application " | Freq: Once | CUTANEOUS | Status: DC
  Filled 2013-02-16: qty 15

## 2013-02-16 MED ORDER — DEXAMETHASONE SODIUM PHOSPHATE 10 MG/ML IJ SOLN
INTRAMUSCULAR | Status: DC | PRN
  Administered 2013-02-16: 10 mg via INTRAVENOUS

## 2013-02-16 MED ORDER — HYDROMORPHONE HCL PF 1 MG/ML IJ SOLN
INTRAMUSCULAR | Status: AC
  Filled 2013-02-16: qty 1

## 2013-02-16 MED ORDER — LACTATED RINGERS IV SOLN
INTRAVENOUS | Status: DC | PRN
  Administered 2013-02-16: 1000 mL

## 2013-02-16 MED ORDER — MORPHINE SULFATE 10 MG/ML IJ SOLN: 2.0000 mg | INTRAMUSCULAR | Status: DC | PRN

## 2013-02-16 MED ORDER — NEOSTIGMINE METHYLSULFATE 1 MG/ML IJ SOLN
INTRAMUSCULAR | Status: DC | PRN
  Administered 2013-02-16: 4 mg via INTRAVENOUS

## 2013-02-16 MED ORDER — OXYCODONE-ACETAMINOPHEN 5-325 MG PO TABS: 1.0000 | ORAL_TABLET | ORAL | Status: DC | PRN

## 2013-02-16 MED ORDER — BUPIVACAINE-EPINEPHRINE 0.25% -1:200000 IJ SOLN
INTRAMUSCULAR | Status: DC | PRN
  Administered 2013-02-16: 20 mL

## 2013-02-16 MED ORDER — MIDAZOLAM HCL 5 MG/5ML IJ SOLN
INTRAMUSCULAR | Status: DC | PRN
  Administered 2013-02-16: 2 mg via INTRAVENOUS

## 2013-02-16 MED ORDER — ONDANSETRON HCL 4 MG/2ML IJ SOLN: 4.0000 mg | INTRAMUSCULAR | Status: DC | PRN

## 2013-02-16 MED ORDER — AMLODIPINE BESYLATE 5 MG PO TABS
5.0000 mg | ORAL_TABLET | Freq: Every day | ORAL | Status: DC
  Administered 2013-02-16: 5 mg via ORAL
  Filled 2013-02-16: qty 1

## 2013-02-16 MED ORDER — CIPROFLOXACIN IN D5W 400 MG/200ML IV SOLN
400.0000 mg | INTRAVENOUS | Status: AC
  Administered 2013-02-16: 400 mg via INTRAVENOUS

## 2013-02-16 MED ORDER — PROPOFOL 10 MG/ML IV BOLUS
INTRAVENOUS | Status: DC | PRN
  Administered 2013-02-16: 200 mg via INTRAVENOUS

## 2013-02-16 MED ORDER — ROCURONIUM BROMIDE 100 MG/10ML IV SOLN
INTRAVENOUS | Status: DC | PRN
  Administered 2013-02-16: 50 mg via INTRAVENOUS

## 2013-02-16 SURGICAL SUPPLY — 39 items
APPLIER CLIP ROT 10 11.4 M/L (STAPLE) ×2
BENZOIN TINCTURE PRP APPL 2/3 (GAUZE/BANDAGES/DRESSINGS) ×2 IMPLANT
CANISTER SUCTION 2500CC (MISCELLANEOUS) ×2 IMPLANT
CHLORAPREP W/TINT 26ML (MISCELLANEOUS) ×2 IMPLANT
CLIP APPLIE ROT 10 11.4 M/L (STAPLE) ×1 IMPLANT
CLOTH BEACON ORANGE TIMEOUT ST (SAFETY) ×2 IMPLANT
COVER MAYO STAND STRL (DRAPES) ×2 IMPLANT
DECANTER SPIKE VIAL GLASS SM (MISCELLANEOUS) IMPLANT
DRAPE C-ARM 42X120 X-RAY (DRAPES) ×2 IMPLANT
DRAPE LAPAROSCOPIC ABDOMINAL (DRAPES) ×2 IMPLANT
DRAPE UTILITY XL STRL (DRAPES) ×2 IMPLANT
DRSG TEGADERM 2-3/8X2-3/4 SM (GAUZE/BANDAGES/DRESSINGS) ×6 IMPLANT
DRSG TEGADERM 4X4.75 (GAUZE/BANDAGES/DRESSINGS) ×2 IMPLANT
ELECT REM PT RETURN 9FT ADLT (ELECTROSURGICAL) ×2
ELECTRODE REM PT RTRN 9FT ADLT (ELECTROSURGICAL) ×1 IMPLANT
FILTER SMOKE EVAC LAPAROSHD (FILTER) ×2 IMPLANT
GLOVE BIO SURGEON STRL SZ7 (GLOVE) ×2 IMPLANT
GLOVE BIOGEL PI IND STRL 7.0 (GLOVE) ×1 IMPLANT
GLOVE BIOGEL PI IND STRL 7.5 (GLOVE) ×1 IMPLANT
GLOVE BIOGEL PI INDICATOR 7.0 (GLOVE) ×1
GLOVE BIOGEL PI INDICATOR 7.5 (GLOVE) ×1
GOWN STRL NON-REIN LRG LVL3 (GOWN DISPOSABLE) ×4 IMPLANT
GOWN STRL REIN XL XLG (GOWN DISPOSABLE) ×2 IMPLANT
KIT BASIN OR (CUSTOM PROCEDURE TRAY) ×2 IMPLANT
NS IRRIG 1000ML POUR BTL (IV SOLUTION) ×2 IMPLANT
POUCH SPECIMEN RETRIEVAL 10MM (ENDOMECHANICALS) ×2 IMPLANT
RINGERS IRRIG 1000ML POUR BTL (IV SOLUTION) ×2 IMPLANT
SCISSORS LAP 5X35 DISP (ENDOMECHANICALS) ×2 IMPLANT
SET CHOLANGIOGRAPH MIX (MISCELLANEOUS) ×2 IMPLANT
SET IRRIG TUBING LAPAROSCOPIC (IRRIGATION / IRRIGATOR) ×2 IMPLANT
SOLUTION ANTI FOG 6CC (MISCELLANEOUS) ×2 IMPLANT
STRIP CLOSURE SKIN 1/2X4 (GAUZE/BANDAGES/DRESSINGS) ×2 IMPLANT
SUT MNCRL AB 4-0 PS2 18 (SUTURE) ×2 IMPLANT
TOWEL OR 17X26 10 PK STRL BLUE (TOWEL DISPOSABLE) ×2 IMPLANT
TRAY LAP CHOLE (CUSTOM PROCEDURE TRAY) ×2 IMPLANT
TROCAR BLADELESS OPT 5 75 (ENDOMECHANICALS) ×4 IMPLANT
TROCAR XCEL BLUNT TIP 100MML (ENDOMECHANICALS) ×2 IMPLANT
TROCAR XCEL NON-BLD 11X100MML (ENDOMECHANICALS) ×2 IMPLANT
TUBING INSUFFLATION 10FT LAP (TUBING) ×2 IMPLANT

## 2013-02-16 NOTE — Transfer of Care (Signed)
Immediate Anesthesia Transfer of Care Note  Patient: Victoria [name]  Procedure(s) Performed: Procedure(s): LAPAROSCOPIC CHOLECYSTECTOMY WITH INTRAOPERATIVE CHOLANGIOGRAM (N/A)  Patient Location: PACU  Anesthesia Type:General  Level of Consciousness: oriented, sedated, patient cooperative and responds to stimulation  Airway & Oxygen Therapy: Patient Spontanous Breathing and Patient connected to face mask oxygen  Post-op Assessment: Report given to PACU RN and Post -op Vital signs reviewed and stable  Post vital signs: Reviewed and stable  Complications: No apparent anesthesia complications

## 2013-02-16 NOTE — H&P (View-Only) (Signed)
Patient ID: Victoria [name], female   DOB: Jan 11, 1971, 42 y.o.   MRN: [id number]  Chief Complaint  Patient presents with  . New Evaluation    eval GB    HPI [name] is a 42 y.o. female.  Referred by Dr. Parke Simmers for evaluation of gallbladder disease  HPI This is a 42 year old female who presents after recent trip to the emergency department for severe right-sided abdominal pain associated with nausea, vomiting, and abdominal bloating.  She was evaluated and was noted to have a 1 cm gallstone. Her liver function tests were normal. She reports about a year of intermittent "indigestion" with postprandial upper abdominal pain and bloating. This most recent episode was by far the worst. She presents today to discuss cholecystectomy  Past Medical History  Diagnosis Date  . Hypertension   . Diabetes mellitus without complication   . Asthma     Past Surgical History  Procedure Laterality Date  . Foot surgery Left 2000  . Ectopic pregnancy surgery  2001    Family History  Problem Relation Age of Onset  . Cancer Paternal Grandmother     breast  . Cancer Paternal Grandfather     lung    Social History History  Substance Use Topics  . Smoking status: Former Smoker    Quit date: 08/14/2012  . Smokeless tobacco: Never Used  . Alcohol Use: No    Allergies  Allergen Reactions  . Codeine Other (See Comments)    "messes with my heartbeat"  . Penicillins Other (See Comments)    "messes with my heartbeat"    Current Outpatient Prescriptions  Medication Sig Dispense Refill  . HYDROcodone-acetaminophen (NORCO/VICODIN) 5-325 MG per tablet Take 1 tablet by mouth every 4 (four) hours as needed for pain.  20 tablet  0  . naproxen sodium (ANAPROX) 220 MG tablet Take 440 mg by mouth daily as needed (for pain).       . Olmesartan-Amlodipine-HCTZ (TRIBENZOR) 40-10-25 MG TABS Take by mouth.      . ondansetron (ZOFRAN) 4 MG tablet Take 1 tablet (4 mg total) by mouth every 6 (six) hours.  12  tablet  0   No current facility-administered medications for this visit.    Review of Systems Review of Systems  Constitutional: Negative for fever, chills and unexpected weight change.  HENT: Negative for hearing loss, congestion, sore throat, trouble swallowing and voice change.   Eyes: Negative for visual disturbance.  Respiratory: Negative for cough and wheezing.   Cardiovascular: Positive for palpitations. Negative for chest pain and leg swelling.  Gastrointestinal: Positive for nausea, abdominal pain and abdominal distention. Negative for vomiting, diarrhea, constipation, blood in stool and anal bleeding.  Genitourinary: Negative for hematuria, vaginal bleeding and difficulty urinating.  Musculoskeletal: Negative for arthralgias.  Skin: Negative for rash and wound.  Neurological: Positive for weakness and headaches. Negative for seizures and syncope.  Hematological: Negative for adenopathy. Does not bruise/bleed easily.  Psychiatric/Behavioral: Negative for confusion.    Blood pressure 112/72, pulse 98, temperature 97.3 F (36.3 C), temperature source Temporal, resp. rate 16, height 5' 8.5" (1.74 m), weight 221 lb (100.245 kg), last menstrual period 01/05/2013.  Physical Exam Physical Exam WDWN in NAD HEENT:  EOMI, sclera anicteric Neck:  No masses, no thyromegaly Lungs:  CTA bilaterally; normal respiratory effort CV:  Regular rate and rhythm; no murmurs Abd:  +bowel sounds, obese; mildly distended; tender in RUQ/ epigastrium Ext:  Well-perfused; no edema Skin:  Warm, dry; no sign of jaundice  Data Reviewed Lab Results  Component Value Date   WBC 8.7 01/20/2013   HGB 13.7 01/20/2013   HCT 40.1 01/20/2013   MCV 79.9 01/20/2013   PLT 363 01/20/2013   Lab Results  Component Value Date   CREATININE 0.84 01/20/2013   BUN 13 01/20/2013   NA 141 01/20/2013   K 4.0 01/20/2013   CL 106 01/20/2013   CO2 24 01/20/2013   Lab Results  Component Value Date   ALT 11 01/20/2013    AST 17 01/20/2013   ALKPHOS 76 01/20/2013   BILITOT 0.3 01/20/2013   Lab Results  Component Value Date   LIPASE 15 01/20/2013    COMPLETE ABDOMINAL ULTRASOUND  Comparison: 08/03/2010 pelvic ultrasound.  Findings:  Gallbladder: Mobile gallstone, 1 cm in diameter. No gallbladder  wall thickening or pericholecystic fluid. Sonographic Murphy's  sign absent.  Common bile duct: 4 mm in diameter, within normal limits.  Liver: No focal lesion identified. Within normal limits in  parenchymal echogenicity.  IVC: Appears normal.  Pancreas: Borderline dilated dorsal pancreatic duct along the  pancreatic head and body, 3 mm.  Spleen: Measures 4.9 cm craniocaudad, unremarkable.  Right Kidney: Measures 12.4 cm in length. Similar echogenicity to  the adjacent liver. No definite calculi or other abnormality.  Left Kidney: Measures 12.1 cm in length. Difficult to see due to  positioning and adjacent ribs. No definite abnormality.  Abdominal aorta: No aneurysm identified.  IMPRESSION:  1. Cholelithiasis, with single mobile gallstone observed.  2. Borderline dilatation of the dorsal pancreatic duct in the  vicinity of the pancreatic head. No CBD dilatation. Significance  of this finding is uncertain.  Original Report Authenticated By: Gaylyn Rong, M.D.   CT ABDOMEN AND PELVIS WITH CONTRAST  Technique: Multidetector CT imaging of the abdomen and pelvis was  performed following the standard protocol during bolus  administration of intravenous contrast.  Contrast: OMNIPAQUE IOHEXOL 300 MG/ML SOLN  Comparison: Ultrasound 01/20/2013  Findings: Lung bases are clear. No pericardial fluid.  No focal hepatic lesion. The gallbladder, pancreas, spleen,  adrenal glands, and kidneys are normal. Small 5 mm gallstone within  the gallbladder.  The stomach, small bowel, and appendix are normal. Colon and  rectosigmoid colon are normal.  Abdominal aorta is normal caliber. No retroperitoneal  periportal  lymphadenopathy.  No free fluid the pelvis. Uterus and bladder normal. No pelvic  lymphadenopathy. Review of bone windows demonstrates no aggressive  osseous lesions.  IMPRESSION:  1. No acute abdominal or pelvic findings.  2. Normal appendix.  3. Small gallstone in the gallbladder with no evidence  cholecystitis.  Original Report Authenticated By: Genevive Bi, M.D.         Assessment    Chronic calculus cholecystitis    Plan    Laparoscopic cholecystectomy with intraoperative cholangiogram. The surgical procedure has been discussed with the patient.  Potential risks, benefits, alternative treatments, and expected outcomes have been explained.  All of the patient's questions at this time have been answered.  The likelihood of reaching the patient's treatment goal is good.  The patient understand the proposed surgical procedure and wishes to proceed.         Jared Whorley K. 01/29/2013, 5:10 PM

## 2013-02-16 NOTE — Preoperative (Signed)
Beta Blockers   Reason not to administer Beta Blockers:Not Applicable 

## 2013-02-16 NOTE — Interval H&P Note (Signed)
History and Physical Interval Note:  02/16/2013 12:50 PM  Victoria [name]  has presented today for surgery, with the diagnosis of chronic calculus cholecystitis  The various methods of treatment have been discussed with the patient and family. After consideration of risks, benefits and other options for treatment, the patient has consented to  Procedure(s): LAPAROSCOPIC CHOLECYSTECTOMY WITH INTRAOPERATIVE CHOLANGIOGRAM (N/A) as a surgical intervention .  The patient's history has been reviewed, patient examined, no change in status, stable for surgery.  I have reviewed the patient's chart and labs.  Questions were answered to the patient's satisfaction.     Reanna Scoggin K.

## 2013-02-16 NOTE — Anesthesia Postprocedure Evaluation (Signed)
  Anesthesia Post-op Note  Patient: Victoria [name]  Procedure(s) Performed: Procedure(s) (LRB): LAPAROSCOPIC CHOLECYSTECTOMY WITH INTRAOPERATIVE CHOLANGIOGRAM (N/A)  Patient Location: PACU  Anesthesia Type: General  Level of Consciousness: awake and alert   Airway and Oxygen Therapy: Patient Spontanous Breathing  Post-op Pain: mild  Post-op Assessment: Post-op Vital signs reviewed, Patient's Cardiovascular Status Stable, Respiratory Function Stable, Patent Airway and No signs of Nausea or vomiting  Last Vitals:  Filed Vitals:   02/16/13 1500  BP: 128/70  Pulse: 87  Temp:   Resp: 20    Post-op Vital Signs: stable   Complications: No apparent anesthesia complications

## 2013-02-16 NOTE — Anesthesia Preprocedure Evaluation (Addendum)
Anesthesia Evaluation  Patient identified by MRN, date of birth, ID band Patient awake    Reviewed: Allergy & Precautions, H&P , NPO status , Patient's Chart, lab work & pertinent test results  Airway Mallampati: III TM Distance: <3 FB Neck ROM: Full    Dental no notable dental hx.    Pulmonary neg pulmonary ROS,  breath sounds clear to auscultation  Pulmonary exam normal       Cardiovascular hypertension, Pt. on medications Rhythm:Regular Rate:Normal     Neuro/Psych negative neurological ROS  negative psych ROS   GI/Hepatic negative GI ROS, Neg liver ROS,   Endo/Other  diabetesMorbid obesity  Renal/GU negative Renal ROS  negative genitourinary   Musculoskeletal negative musculoskeletal ROS (+)   Abdominal   Peds negative pediatric ROS (+)  Hematology negative hematology ROS (+)   Anesthesia Other Findings   Reproductive/Obstetrics negative OB ROS                           Anesthesia Physical Anesthesia Plan  ASA: III  Anesthesia Plan: General   Post-op Pain Management:    Induction: Intravenous  Airway Management Planned: Oral ETT  Additional Equipment:   Intra-op Plan:   Post-operative Plan: Extubation in OR  Informed Consent: I have reviewed the patients History and Physical, chart, labs and discussed the procedure including the risks, benefits and alternatives for the proposed anesthesia with the patient or authorized representative who has indicated his/her understanding and acceptance.   Dental advisory given  Plan Discussed with: CRNA and Surgeon  Anesthesia Plan Comments:         Anesthesia Quick Evaluation

## 2013-02-16 NOTE — Op Note (Signed)
Laparoscopic Cholecystectomy with IOC Procedure Note  Indications: This patient presents with symptomatic gallbladder disease and will undergo laparoscopic cholecystectomy.  Pre-operative Diagnosis: Calculus of gallbladder with other cholecystitis, without mention of obstruction  Post-operative Diagnosis: Same  Surgeon: Addis Bennie K.   Assistants: none  Anesthesia: General endotracheal anesthesia  ASA Class: 1  Procedure Details  The patient was seen again in the Holding Room. The risks, benefits, complications, treatment options, and expected outcomes were discussed with the patient. The possibilities of reaction to medication, pulmonary aspiration, perforation of viscus, bleeding, recurrent infection, finding a normal gallbladder, the need for additional procedures, failure to diagnose a condition, the possible need to convert to an open procedure, and creating a complication requiring transfusion or operation were discussed with the patient. The likelihood of improving the patient's symptoms with return to their baseline status is good.  The patient and/or family concurred with the proposed plan, giving informed consent. The site of surgery properly noted. The patient was taken to Operating Room, identified as Nysa M Long and the procedure verified as Laparoscopic Cholecystectomy with Intraoperative Cholangiogram. A Time Out was held and the above information confirmed.  Prior to the induction of general anesthesia, antibiotic prophylaxis was administered. General endotracheal anesthesia was then administered and tolerated well. After the induction, the abdomen was prepped with Chloraprep and draped in the sterile fashion. The patient was positioned in the supine position.  Local anesthetic agent was injected into the skin near the umbilicus and an incision made. We dissected down to the abdominal fascia with blunt dissection.  The fascia was incised vertically and we entered the  peritoneal cavity bluntly.  A pursestring suture of 0-Vicryl was placed around the fascial opening.  The Hasson cannula was inserted and secured with the stay suture.  Pneumoperitoneum was then created with CO2 and tolerated well without any adverse changes in the patient's vital signs. An 11-mm port was placed in the subxiphoid position.  Two 5-mm ports were placed in the right upper quadrant. All skin incisions were infiltrated with a local anesthetic agent before making the incision and placing the trocars.   We positioned the patient in reverse Trendelenburg, tilted slightly to the patient's left.  The gallbladder was identified, the fundus grasped and retracted cephalad. Adhesions were lysed bluntly and with the electrocautery where indicated, taking care not to injure any adjacent organs or viscus. The infundibulum was grasped and retracted laterally, exposing the peritoneum overlying the triangle of Calot. This was then divided and exposed in a blunt fashion. A critical view of the cystic duct and cystic artery was obtained.  The cystic duct was clearly identified and bluntly dissected circumferentially. The cystic duct was ligated with a clip distally.   An incision was made in the cystic duct and the [facility] cholangiogram catheter introduced. The catheter was secured using a clip. A cholangiogram was then obtained which showed good visualization of the distal and proximal biliary tree with no sign of filling defects or obstruction.  Contrast flowed easily into the duodenum. The catheter was then removed.   The cystic duct was then ligated with clips and divided. The cystic artery was identified, dissected free, ligated with clips and divided as well.   The gallbladder was dissected from the liver bed in retrograde fashion with the electrocautery. The gallbladder was removed and placed in an Endocatch sac. The liver bed was irrigated and inspected. Hemostasis was achieved with the electrocautery. Copious  irrigation was utilized and was repeatedly aspirated until clear.  The gallbladder and Endocatch sac were then removed through the umbilical port site.  The pursestring suture was used to close the umbilical fascia.    We again inspected the right upper quadrant for hemostasis.  Pneumoperitoneum was released as we removed the trocars.  4-0 Monocryl was used to close the skin.   Benzoin, steri-strips, and clean dressings were applied. The patient was then extubated and brought to the recovery room in stable condition. Instrument, sponge, and needle counts were correct at closure and at the conclusion of the case.   Findings: Cholecystitis with Cholelithiasis  Estimated Blood Loss: Minimal         Drains: none         Specimens: Gallbladder           Complications: None; patient tolerated the procedure well.         Disposition: PACU - hemodynamically stable.         Condition: stable  Wilmon Arms. Corliss Skains, MD, [facility]  General/ Trauma Surgery  02/16/2013 2:36 PM

## 2013-02-17 ENCOUNTER — Encounter (HOSPITAL_COMMUNITY): Payer: Self-pay | Admitting: Surgery

## 2013-02-24 ENCOUNTER — Ambulatory Visit: Payer: BC Managed Care – PPO

## 2013-03-02 ENCOUNTER — Encounter (INDEPENDENT_AMBULATORY_CARE_PROVIDER_SITE_OTHER): Payer: Self-pay | Admitting: Surgery

## 2013-03-02 ENCOUNTER — Ambulatory Visit (INDEPENDENT_AMBULATORY_CARE_PROVIDER_SITE_OTHER): Payer: BC Managed Care – PPO | Admitting: Surgery

## 2013-03-02 VITALS — BP 119/77 | HR 70 | Temp 98.1°F | Resp 14 | Ht 68.5 in | Wt 223.8 lb

## 2013-03-02 DIAGNOSIS — K8010 Calculus of gallbladder with chronic cholecystitis without obstruction: Secondary | ICD-10-CM

## 2013-03-02 NOTE — Progress Notes (Signed)
Status post laparoscopic cholecystectomy on 02/16/13. The patient is doing well. She has been having some constipation but she is still using occasional pain medicine. Recommended using stool softeners and MiraLAX. Her incisions are all well-healed no sign of infection. She may resume full activity. Followup as needed.  Wilmon Arms. Corliss Skains, MD, [facility]  General/ Trauma Surgery  03/02/2013 1:39 PM

## 2013-03-02 NOTE — Patient Instructions (Addendum)
Colace stool softeners twice a day Miralax as needed

## 2013-03-03 ENCOUNTER — Ambulatory Visit: Admit: 2013-03-03 | Payer: Self-pay | Admitting: Surgery

## 2013-03-03 SURGERY — LAPAROSCOPIC CHOLECYSTECTOMY WITH INTRAOPERATIVE CHOLANGIOGRAM
Anesthesia: General

## 2013-03-17 ENCOUNTER — Encounter (INDEPENDENT_AMBULATORY_CARE_PROVIDER_SITE_OTHER): Payer: BC Managed Care – PPO | Admitting: Surgery

## 2013-05-05 ENCOUNTER — Other Ambulatory Visit: Payer: Self-pay | Admitting: Obstetrics and Gynecology

## 2013-05-19 ENCOUNTER — Encounter (HOSPITAL_COMMUNITY): Payer: Self-pay | Admitting: Pharmacist

## 2013-05-21 ENCOUNTER — Encounter (HOSPITAL_COMMUNITY): Payer: Self-pay

## 2013-05-21 ENCOUNTER — Encounter (HOSPITAL_COMMUNITY)
Admission: RE | Admit: 2013-05-21 | Discharge: 2013-05-21 | Disposition: A | Discharge status: Home / Self Care | Payer: BC Managed Care – PPO | Source: Ambulatory Visit | Attending: Obstetrics and Gynecology | Admitting: Obstetrics and Gynecology

## 2013-05-21 LAB — BASIC METABOLIC PANEL
CO2: 24 mEq/L (ref 19–32)
Calcium: 9.2 mg/dL (ref 8.4–10.5)
Creatinine, Ser: 0.76 mg/dL (ref 0.50–1.10)
GFR calc non Af Amer: >90 mL/min (ref >90)
Glucose, Bld: 161 mg/dL — ABNORMAL HIGH (ref 70–99)
Sodium: 139 mEq/L (ref 135–145)

## 2013-05-21 LAB — CBC
HCT: 37.3 % (ref 36.0–46.0)
MCH: 26.5 pg (ref 26.0–34.0)
MCHC: 33 g/dL (ref 30.0–36.0)
MCV: 80.4 fL (ref 78.0–100.0)
RDW: 14.3 % (ref 11.5–15.5)
WBC: 8.4 10*3/uL (ref 4.0–10.5)

## 2013-05-21 NOTE — Patient Instructions (Signed)
Your procedure is scheduled on:05/22/13  Enter through the Main Entrance at :1pm Pick up desk phone and dial [phone] and inform us of your arrival.  Please call [phone] if you have any problems the morning of surgery.  Remember: Do not eat food after midnight:tonight Clear liquids are ok until:10am on Friday  You may brush your teeth the morning of surgery.  Take these meds the morning of surgery with a sip of water:BP pill, bring inhaler to hospital Hold Metformin until after surgery  DO NOT wear jewelry, eye make-up, lipstick,body lotion, or dark fingernail polish.  (Polished toes are ok) You may wear deodorant.  If you are to be admitted after surgery, leave suitcase in car until your room has been assigned. Patients discharged on the day of surgery will not be allowed to drive home. Wear loose fitting, comfortable clothes for your ride home.

## 2013-05-22 ENCOUNTER — Ambulatory Visit (HOSPITAL_COMMUNITY): Payer: BC Managed Care – PPO | Admitting: Anesthesiology

## 2013-05-22 ENCOUNTER — Encounter (HOSPITAL_COMMUNITY): Payer: Self-pay | Admitting: Anesthesiology

## 2013-05-22 ENCOUNTER — Ambulatory Visit (HOSPITAL_COMMUNITY)
Admission: RE | Admit: 2013-05-22 | Discharge: 2013-05-22 | Disposition: A | Discharge status: Home / Self Care | Payer: BC Managed Care – PPO | Source: Ambulatory Visit | Attending: Obstetrics and Gynecology | Admitting: Obstetrics and Gynecology

## 2013-05-22 ENCOUNTER — Encounter (HOSPITAL_COMMUNITY)
Admission: RE | Disposition: A | Discharge status: Home / Self Care | Payer: Self-pay | Source: Ambulatory Visit | Attending: Obstetrics and Gynecology

## 2013-05-22 ENCOUNTER — Encounter (HOSPITAL_COMMUNITY): Payer: Self-pay | Admitting: *Deleted

## 2013-05-22 DIAGNOSIS — D250 Submucous leiomyoma of uterus: Secondary | ICD-10-CM

## 2013-05-22 DIAGNOSIS — N920 Excessive and frequent menstruation with regular cycle: Secondary | ICD-10-CM | POA: Insufficient documentation

## 2013-05-22 HISTORY — PX: DILATATION & CURRETTAGE/HYSTEROSCOPY WITH RESECTOCOPE: SHX5572

## 2013-05-22 SURGERY — DILATATION & CURETTAGE/HYSTEROSCOPY WITH RESECTOCOPE
Anesthesia: General | Site: Uterus | Wound class: Clean Contaminated

## 2013-05-22 MED ORDER — ONDANSETRON HCL 4 MG/2ML IJ SOLN: 4.0000 mg | Freq: Once | INTRAMUSCULAR | Status: DC | PRN

## 2013-05-22 MED ORDER — ONDANSETRON HCL 4 MG/2ML IJ SOLN
INTRAMUSCULAR | Status: AC
  Filled 2013-05-22: qty 2

## 2013-05-22 MED ORDER — FENTANYL CITRATE 0.05 MG/ML IJ SOLN
INTRAMUSCULAR | Status: AC
  Filled 2013-05-22: qty 2

## 2013-05-22 MED ORDER — IBUPROFEN 800 MG PO TABS: 800.0000 mg | ORAL_TABLET | Freq: Three times a day (TID) | ORAL | Status: DC | PRN

## 2013-05-22 MED ORDER — LACTATED RINGERS IV SOLN
INTRAVENOUS | Status: DC
  Administered 2013-05-22: 15:00:00 via INTRAVENOUS
  Administered 2013-05-22: 125 mL/h via INTRAVENOUS

## 2013-05-22 MED ORDER — GLYCINE 1.5 % IR SOLN
Status: DC | PRN
  Administered 2013-05-22: 3000 mL

## 2013-05-22 MED ORDER — FENTANYL CITRATE 0.05 MG/ML IJ SOLN
INTRAMUSCULAR | Status: DC | PRN
  Administered 2013-05-22: 50 ug via INTRAVENOUS
  Administered 2013-05-22: 100 ug via INTRAVENOUS
  Administered 2013-05-22 (×2): 50 ug via INTRAVENOUS

## 2013-05-22 MED ORDER — MIDAZOLAM HCL 2 MG/2ML IJ SOLN
INTRAMUSCULAR | Status: DC | PRN
  Administered 2013-05-22: 2 mg via INTRAVENOUS

## 2013-05-22 MED ORDER — ONDANSETRON HCL 4 MG/2ML IJ SOLN
INTRAMUSCULAR | Status: DC | PRN
  Administered 2013-05-22: 4 mg via INTRAVENOUS

## 2013-05-22 MED ORDER — LIDOCAINE HCL (CARDIAC) 20 MG/ML IV SOLN
INTRAVENOUS | Status: AC
  Filled 2013-05-22: qty 5

## 2013-05-22 MED ORDER — FENTANYL CITRATE 0.05 MG/ML IJ SOLN
INTRAMUSCULAR | Status: AC
  Administered 2013-05-22: 50 ug via INTRAVENOUS
  Filled 2013-05-22: qty 2

## 2013-05-22 MED ORDER — PROPOFOL 10 MG/ML IV EMUL
INTRAVENOUS | Status: AC
  Filled 2013-05-22: qty 20

## 2013-05-22 MED ORDER — FENTANYL CITRATE 0.05 MG/ML IJ SOLN
INTRAMUSCULAR | Status: AC
  Filled 2013-05-22: qty 5

## 2013-05-22 MED ORDER — LIDOCAINE HCL (CARDIAC) 20 MG/ML IV SOLN
INTRAVENOUS | Status: DC | PRN
  Administered 2013-05-22: 70 mg via INTRAVENOUS

## 2013-05-22 MED ORDER — KETOROLAC TROMETHAMINE 30 MG/ML IJ SOLN
INTRAMUSCULAR | Status: DC | PRN
  Administered 2013-05-22: 30 mg via INTRAVENOUS
  Administered 2013-05-22: 30 mg via INTRAMUSCULAR

## 2013-05-22 MED ORDER — FENTANYL CITRATE 0.05 MG/ML IJ SOLN
25.0000 ug | INTRAMUSCULAR | Status: DC | PRN
  Administered 2013-05-22: 50 ug via INTRAVENOUS

## 2013-05-22 MED ORDER — KETOROLAC TROMETHAMINE 30 MG/ML IJ SOLN
INTRAMUSCULAR | Status: AC
  Filled 2013-05-22: qty 2

## 2013-05-22 MED ORDER — MEPERIDINE HCL 25 MG/ML IJ SOLN: 6.2500 mg | INTRAMUSCULAR | Status: DC | PRN

## 2013-05-22 MED ORDER — SODIUM CHLORIDE 0.9 % IR SOLN
Status: DC | PRN
  Administered 2013-05-22: 3000 mL

## 2013-05-22 MED ORDER — PROPOFOL 10 MG/ML IV BOLUS
INTRAVENOUS | Status: DC | PRN
  Administered 2013-05-22: 200 mg via INTRAVENOUS

## 2013-05-22 MED ORDER — MIDAZOLAM HCL 2 MG/2ML IJ SOLN
INTRAMUSCULAR | Status: AC
  Filled 2013-05-22: qty 2

## 2013-05-22 MED ORDER — KETOROLAC TROMETHAMINE 30 MG/ML IJ SOLN: 15.0000 mg | Freq: Once | INTRAMUSCULAR | Status: DC | PRN

## 2013-05-22 SURGICAL SUPPLY — 18 items
CANISTER SUCTION 2500CC (MISCELLANEOUS) ×2 IMPLANT
CATH ROBINSON RED A/P 16FR (CATHETERS) ×2 IMPLANT
CLOTH BEACON ORANGE TIMEOUT ST (SAFETY) ×2 IMPLANT
CONTAINER PREFILL 10% NBF 60ML (FORM) ×4 IMPLANT
DRESSING TELFA 8X3 (GAUZE/BANDAGES/DRESSINGS) ×2 IMPLANT
ELECT REM PT RETURN 9FT ADLT (ELECTROSURGICAL) ×2
ELECTRODE REM PT RTRN 9FT ADLT (ELECTROSURGICAL) ×1 IMPLANT
ELECTRODE ROLLER VERSAPOINT (ELECTRODE) IMPLANT
ELECTRODE RT ANGLE VERSAPOINT (CUTTING LOOP) ×2 IMPLANT
GLOVE BIO SURGEON STRL SZ 6.5 (GLOVE) ×2 IMPLANT
GLOVE BIOGEL PI IND STRL 7.0 (GLOVE) ×1 IMPLANT
GLOVE BIOGEL PI INDICATOR 7.0 (GLOVE) ×1
GOWN STRL REIN XL XLG (GOWN DISPOSABLE) ×4 IMPLANT
LOOP ANGLED CUTTING 22FR (CUTTING LOOP) ×2 IMPLANT
PACK HYSTEROSCOPY LF (CUSTOM PROCEDURE TRAY) ×2 IMPLANT
PAD OB MATERNITY 4.3X12.25 (PERSONAL CARE ITEMS) ×2 IMPLANT
TOWEL OR 17X24 6PK STRL BLUE (TOWEL DISPOSABLE) ×4 IMPLANT
WATER STERILE IRR 1000ML POUR (IV SOLUTION) ×2 IMPLANT

## 2013-05-22 NOTE — Anesthesia Preprocedure Evaluation (Addendum)
Anesthesia Evaluation  Patient identified by MRN, date of birth, ID band Patient awake    Reviewed: Allergy & Precautions, H&P , NPO status , Patient's Chart, lab work & pertinent test results  Airway Mallampati: II TM Distance: >3 FB Neck ROM: full    Dental no notable dental hx. (+) Teeth Intact   Pulmonary    Pulmonary exam normal       Cardiovascular hypertension, Pt. on medications     Neuro/Psych negative neurological ROS  negative psych ROS   GI/Hepatic negative GI ROS, Neg liver ROS,   Endo/Other  diabetes, Type 2, Oral Hypoglycemic Agents  Renal/GU negative Renal ROS  negative genitourinary   Musculoskeletal negative musculoskeletal ROS (+)   Abdominal (+) + obese,   Peds  Hematology negative hematology ROS (+)   Anesthesia Other Findings   Reproductive/Obstetrics negative OB ROS                          Anesthesia Physical Anesthesia Plan  ASA: II  Anesthesia Plan: General   Post-op Pain Management:    Induction: Intravenous  Airway Management Planned: LMA  Additional Equipment:   Intra-op Plan:   Post-operative Plan:   Informed Consent: I have reviewed the patients History and Physical, chart, labs and discussed the procedure including the risks, benefits and alternatives for the proposed anesthesia with the patient or authorized representative who has indicated his/her understanding and acceptance.     Plan Discussed with: CRNA and Surgeon  Anesthesia Plan Comments:        Anesthesia Quick Evaluation

## 2013-05-22 NOTE — Anesthesia Postprocedure Evaluation (Signed)
Anesthesia Post Note  Patient: Victoria [name]  Procedure(s) Performed: Procedure(s) (LRB): DILATATION & CURETTAGE/HYSTEROSCOPY WITH RESECTOCOPE (N/A)  Anesthesia type: General  Patient location: PACU  Post pain: Pain level controlled  Post assessment: Post-op Vital signs reviewed  Last Vitals:  Filed Vitals:   05/22/13 1630  BP:   Pulse: 80  Temp:   Resp: 23    Post vital signs: Reviewed  Level of consciousness: sedated  Complications: No apparent anesthesia complications

## 2013-05-22 NOTE — Transfer of Care (Signed)
Immediate Anesthesia Transfer of Care Note  Patient: Victoria [name]  Procedure(s) Performed: Procedure(s): DILATATION & CURETTAGE/HYSTEROSCOPY WITH RESECTOCOPE (N/A)  Patient Location: PACU  Anesthesia Type:General  Level of Consciousness: awake, alert  and oriented  Airway & Oxygen Therapy: Patient Spontanous Breathing and Patient connected to nasal cannula oxygen  Post-op Assessment: Report given to PACU RN and Post -op Vital signs reviewed and stable  Post vital signs: Reviewed and stable  Complications: No apparent anesthesia complications

## 2013-05-22 NOTE — Brief Op Note (Signed)
05/22/2013  3:56 PM  PATIENT:  Victoria [name]  42 y.o. female  PRE-OPERATIVE DIAGNOSIS:  Menorrhagia, SM fibroid, abnl HSG  POST-OPERATIVE DIAGNOSIS:  Submucosal fibroid, abnormal HSG, menorrhagia  PROCEDURE:  Diagnostic hysteroscopy, hysteroscopic resection of submucosal fibroid  SURGEON:  Surgeon(s) and Role:    * Jensen Kilburg Cathie Beams, MD - Primary  PHYSICIAN ASSISTANT:   ASSISTANTS: none   ANESTHESIA:   general Findings: large right posterior fibroid EBL:  Total I/O In: 1700 [I.V.:1700] Out: 30 [Blood:30]  BLOOD ADMINISTERED:none  DRAINS: none   LOCAL MEDICATIONS USED:  NONE  SPECIMEN:  Source of Specimen:  emc, fibroid resection  DISPOSITION OF SPECIMEN:  PATHOLOGY  COUNTS:  YES  TOURNIQUET:  * No tourniquets in log *  DICTATION: .Other Dictation: Dictation Number [id number]  PLAN OF CARE: Discharge to home after PACU  PATIENT DISPOSITION:  PACU - hemodynamically stable.   Delay start of Pharmacological VTE agent (>24hrs) due to surgical blood loss or risk of bleeding: no

## 2013-05-23 NOTE — Op Note (Signed)
NAME:  [name], Victoria                ACCOUNT NO.:  [id number]  MEDICAL RECORD NO.:  [id number]  LOCATION:  [facility]                          FACILITY:  [facility]  PHYSICIAN:  Maxie Better, M.D.DATE OF BIRTH:  November 26, 1970  DATE OF PROCEDURE:  05/22/2013 DATE OF DISCHARGE:  05/22/2013                              OPERATIVE REPORT   PREOPERATIVE DIAGNOSIS:  Submucosal fibroid, abnormal HSG, menorrhagia.  PROCEDURE:  A diagnostic hysteroscopy, hysteroscopic resection of submucosal fibroid, D and C.  POSTOPERATIVE DIAGNOSIS:  Submucosal fibroid, abnormal HSG, menorrhagia.  ANESTHESIA:  General.  SURGEON:  Maxie Better, MD  ASSISTANT:  None.  PROCEDURE IN DETAIL:  Under adequate general anesthesia, the patient was placed in the dorsal lithotomy position.  She was sterilely prepped and draped in usual fashion.  Bladder was catheterized, small amount of urine.  Examination under anesthesia revealed an anteverted uterus.  No adnexal masses could be appreciated.  A bivalve speculum was placed in vagina.  Single-tooth tenaculum was placed on the anterior lip of the cervix.  The cervix then serially dilated up to #31 [facility] dilator.  A resectoscope was inserted, however noted evidence of a lower segment posterior submucosal fibroid, encasing the majority of the right side of the aspect of the uterus.  The minimal distention and due to size of the fibroid, decision was then made to switch to a VersaPoint resectoscope. The other resectoscope was removed.  The VersaPoint was re-inserted. The fibroid was resected all over, again the cavity had the limitations on being fully distended.  Nonetheless, resection was performed.  The fibroid appeared to have taken entire posterior right side of the uterus.  The ostia could not be seen.  The resected pieces were removed. The cavity was gently curetted and appeared to be smooth.  The procedure was then terminated by removing all instruments from the  vagina.  SPECIMENS:  Endometrial curetting and fibroid resection, sent to Pathology.  FLUID DEFICIT:  250 mL.  COMPLICATION:  None.  The patient tolerated the procedure well, was transferred to recovery in stable condition.     Maxie Better, M.D.     [place]/MEDQ  D:  05/22/2013  T:  05/23/2013  Job:  [id number]

## 2013-05-24 ENCOUNTER — Encounter (HOSPITAL_COMMUNITY): Payer: Self-pay | Admitting: *Deleted

## 2013-05-24 ENCOUNTER — Encounter (HOSPITAL_COMMUNITY)
Admission: AD | Disposition: A | Discharge status: Home / Self Care | Payer: Self-pay | Source: Ambulatory Visit | Attending: Obstetrics and Gynecology

## 2013-05-24 ENCOUNTER — Ambulatory Visit (HOSPITAL_COMMUNITY)
Admission: AD | Admit: 2013-05-24 | Discharge: 2013-05-24 | DRG: 463 | Disposition: A | Discharge status: Home / Self Care | Payer: BC Managed Care – PPO | Source: Ambulatory Visit | Attending: Obstetrics and Gynecology | Admitting: Obstetrics and Gynecology

## 2013-05-24 ENCOUNTER — Inpatient Hospital Stay (HOSPITAL_COMMUNITY): Payer: BC Managed Care – PPO | Admitting: Anesthesiology

## 2013-05-24 ENCOUNTER — Encounter (HOSPITAL_COMMUNITY): Payer: Self-pay | Admitting: Anesthesiology

## 2013-05-24 ENCOUNTER — Inpatient Hospital Stay: Admit: 2013-05-24 | Payer: Self-pay | Admitting: Obstetrics and Gynecology

## 2013-05-24 DIAGNOSIS — N390 Urinary tract infection, site not specified: Secondary | ICD-10-CM | POA: Diagnosis present

## 2013-05-24 DIAGNOSIS — E119 Type 2 diabetes mellitus without complications: Secondary | ICD-10-CM | POA: Diagnosis present

## 2013-05-24 DIAGNOSIS — I10 Essential (primary) hypertension: Secondary | ICD-10-CM | POA: Diagnosis present

## 2013-05-24 DIAGNOSIS — Z23 Encounter for immunization: Secondary | ICD-10-CM | POA: Insufficient documentation

## 2013-05-24 DIAGNOSIS — R109 Unspecified abdominal pain: Secondary | ICD-10-CM | POA: Diagnosis present

## 2013-05-24 DIAGNOSIS — R300 Dysuria: Secondary | ICD-10-CM | POA: Diagnosis present

## 2013-05-24 DIAGNOSIS — G8918 Other acute postprocedural pain: Secondary | ICD-10-CM | POA: Diagnosis present

## 2013-05-24 DIAGNOSIS — Z9889 Other specified postprocedural states: Secondary | ICD-10-CM

## 2013-05-24 DIAGNOSIS — K661 Hemoperitoneum: Secondary | ICD-10-CM | POA: Insufficient documentation

## 2013-05-24 HISTORY — DX: Other specified postprocedural states: Z98.890

## 2013-05-24 HISTORY — PX: LAPAROSCOPY: SHX197

## 2013-05-24 LAB — CBC
MCH: 26.7 pg (ref 26.0–34.0)
MCHC: 33.4 g/dL (ref 30.0–36.0)
MCV: 79.9 fL (ref 78.0–100.0)
Platelets: 267 10*3/uL (ref 150–400)
RBC: 3.59 MIL/uL — ABNORMAL LOW (ref 3.87–5.11)
RDW: 14 % (ref 11.5–15.5)

## 2013-05-24 LAB — GLUCOSE, CAPILLARY: Glucose-Capillary: 112 mg/dL — ABNORMAL HIGH (ref 70–99)

## 2013-05-24 SURGERY — LAPAROSCOPY, DIAGNOSTIC
Anesthesia: General | Site: Abdomen | Wound class: Clean Contaminated

## 2013-05-24 MED ORDER — IRBESARTAN 150 MG PO TABS
150.0000 mg | ORAL_TABLET | Freq: Every day | ORAL | Status: DC
  Filled 2013-05-24 (×2): qty 1

## 2013-05-24 MED ORDER — FAMOTIDINE IN NACL 20-0.9 MG/50ML-% IV SOLN
20.0000 mg | Freq: Once | INTRAVENOUS | Status: AC
  Administered 2013-05-24: 20 mg via INTRAVENOUS
  Filled 2013-05-24: qty 50

## 2013-05-24 MED ORDER — ONDANSETRON HCL 4 MG/2ML IJ SOLN
INTRAMUSCULAR | Status: DC | PRN
  Administered 2013-05-24: 4 mg via INTRAVENOUS

## 2013-05-24 MED ORDER — LINACLOTIDE 145 MCG PO CAPS: 145.0000 ug | ORAL_CAPSULE | Freq: Every day | ORAL | Status: DC

## 2013-05-24 MED ORDER — PNEUMOCOCCAL VAC POLYVALENT 25 MCG/0.5ML IJ INJ
0.5000 mL | INJECTION | Freq: Once | INTRAMUSCULAR | Status: AC
  Administered 2013-05-24: 0.5 mL via INTRAMUSCULAR
  Filled 2013-05-24: qty 0.5

## 2013-05-24 MED ORDER — KETOROLAC TROMETHAMINE 30 MG/ML IJ SOLN: 30.0000 mg | Freq: Four times a day (QID) | INTRAMUSCULAR | Status: DC

## 2013-05-24 MED ORDER — MIDAZOLAM HCL 5 MG/5ML IJ SOLN
INTRAMUSCULAR | Status: DC | PRN
  Administered 2013-05-24: 2 mg via INTRAVENOUS

## 2013-05-24 MED ORDER — FAMOTIDINE IN NACL 20-0.9 MG/50ML-% IV SOLN: 20.0000 mg | Freq: Once | INTRAVENOUS | Status: DC

## 2013-05-24 MED ORDER — LACTATED RINGERS IV SOLN: INTRAVENOUS | Status: DC

## 2013-05-24 MED ORDER — FENTANYL CITRATE 0.05 MG/ML IJ SOLN: 25.0000 ug | INTRAMUSCULAR | Status: DC | PRN

## 2013-05-24 MED ORDER — SUCCINYLCHOLINE CHLORIDE 20 MG/ML IJ SOLN
INTRAMUSCULAR | Status: DC | PRN
  Administered 2013-05-24: 140 mg via INTRAVENOUS

## 2013-05-24 MED ORDER — MENTHOL 3 MG MT LOZG: 1.0000 | LOZENGE | OROMUCOSAL | Status: DC | PRN

## 2013-05-24 MED ORDER — OLMESARTAN-AMLODIPINE-HCTZ 20-5-12.5 MG PO TABS: 1.0000 | ORAL_TABLET | Freq: Every day | ORAL | Status: DC

## 2013-05-24 MED ORDER — INFLUENZA VAC SPLIT QUAD 0.5 ML IM SUSP
0.5000 mL | Freq: Once | INTRAMUSCULAR | Status: AC
  Administered 2013-05-24: 0.5 mL via INTRAMUSCULAR

## 2013-05-24 MED ORDER — ONDANSETRON HCL 4 MG PO TABS: 4.0000 mg | ORAL_TABLET | Freq: Four times a day (QID) | ORAL | Status: DC | PRN

## 2013-05-24 MED ORDER — AMLODIPINE BESYLATE 5 MG PO TABS
5.0000 mg | ORAL_TABLET | Freq: Every day | ORAL | Status: DC
  Filled 2013-05-24 (×2): qty 1

## 2013-05-24 MED ORDER — NEOSTIGMINE METHYLSULFATE 1 MG/ML IJ SOLN
INTRAMUSCULAR | Status: DC | PRN
  Administered 2013-05-24: 3 mg via INTRAVENOUS

## 2013-05-24 MED ORDER — BUPIVACAINE HCL (PF) 0.25 % IJ SOLN
INTRAMUSCULAR | Status: DC | PRN
  Administered 2013-05-24: 5 mL

## 2013-05-24 MED ORDER — HYDROCODONE-ACETAMINOPHEN 5-325 MG PO TABS: 1.0000 | ORAL_TABLET | ORAL | Status: DC | PRN

## 2013-05-24 MED ORDER — GLYCOPYRROLATE 0.2 MG/ML IJ SOLN
INTRAMUSCULAR | Status: DC | PRN
  Administered 2013-05-24: 0.4 mg via INTRAVENOUS

## 2013-05-24 MED ORDER — CIPROFLOXACIN HCL 500 MG PO TABS: 500.0000 mg | ORAL_TABLET | Freq: Two times a day (BID) | ORAL | Status: DC

## 2013-05-24 MED ORDER — ZOLPIDEM TARTRATE 5 MG PO TABS: 5.0000 mg | ORAL_TABLET | Freq: Every evening | ORAL | Status: DC | PRN

## 2013-05-24 MED ORDER — FENTANYL CITRATE 0.05 MG/ML IJ SOLN
INTRAMUSCULAR | Status: DC | PRN
  Administered 2013-05-24: 25 ug via INTRAVENOUS
  Administered 2013-05-24: 50 ug via INTRAVENOUS
  Administered 2013-05-24: 100 ug via INTRAVENOUS
  Administered 2013-05-24: 25 ug via INTRAVENOUS

## 2013-05-24 MED ORDER — LIDOCAINE HCL (CARDIAC) 20 MG/ML IV SOLN
INTRAVENOUS | Status: DC | PRN
  Administered 2013-05-24: 60 mg via INTRAVENOUS

## 2013-05-24 MED ORDER — ONDANSETRON HCL 4 MG/2ML IJ SOLN: 4.0000 mg | Freq: Four times a day (QID) | INTRAMUSCULAR | Status: DC | PRN

## 2013-05-24 MED ORDER — CIPROFLOXACIN IN D5W 400 MG/200ML IV SOLN
400.0000 mg | INTRAVENOUS | Status: AC
  Administered 2013-05-24: 400 mg via INTRAVENOUS
  Filled 2013-05-24: qty 200

## 2013-05-24 MED ORDER — ROCURONIUM BROMIDE 100 MG/10ML IV SOLN
INTRAVENOUS | Status: DC | PRN
  Administered 2013-05-24: 30 mg via INTRAVENOUS

## 2013-05-24 MED ORDER — METFORMIN HCL 500 MG PO TABS
500.0000 mg | ORAL_TABLET | Freq: Two times a day (BID) | ORAL | Status: DC
Start: 2013-05-24 — End: 2013-05-24
  Filled 2013-05-24 (×2): qty 1

## 2013-05-24 MED ORDER — PANTOPRAZOLE SODIUM 40 MG PO TBEC
40.0000 mg | DELAYED_RELEASE_TABLET | Freq: Every day | ORAL | Status: DC
  Filled 2013-05-24 (×2): qty 1

## 2013-05-24 MED ORDER — PROPOFOL 10 MG/ML IV BOLUS
INTRAVENOUS | Status: DC | PRN
  Administered 2013-05-24: 200 mg via INTRAVENOUS

## 2013-05-24 MED ORDER — HYDROCHLOROTHIAZIDE 12.5 MG PO CAPS
12.5000 mg | ORAL_CAPSULE | Freq: Every day | ORAL | Status: DC
  Filled 2013-05-24 (×2): qty 1

## 2013-05-24 MED ORDER — LACTATED RINGERS IV SOLN
INTRAVENOUS | Status: DC
  Administered 2013-05-24 (×2): via INTRAVENOUS

## 2013-05-24 MED ORDER — PHENYLEPHRINE HCL 10 MG/ML IJ SOLN
INTRAMUSCULAR | Status: DC | PRN
  Administered 2013-05-24: 40 ug via INTRAVENOUS

## 2013-05-24 MED ORDER — PROMETHAZINE HCL 25 MG/ML IJ SOLN: 6.2500 mg | INTRAMUSCULAR | Status: DC | PRN

## 2013-05-24 MED ORDER — IBUPROFEN 800 MG PO TABS: 800.0000 mg | ORAL_TABLET | Freq: Three times a day (TID) | ORAL | Status: DC | PRN

## 2013-05-24 MED ORDER — LACTATED RINGERS IR SOLN
Status: DC | PRN
  Administered 2013-05-24: 3000 mL

## 2013-05-24 MED ORDER — CLINDAMYCIN PHOSPHATE 900 MG/50ML IV SOLN
900.0000 mg | INTRAVENOUS | Status: AC
  Administered 2013-05-24: 900 mg via INTRAVENOUS
  Filled 2013-05-24: qty 50

## 2013-05-24 MED ORDER — KETOROLAC TROMETHAMINE 30 MG/ML IJ SOLN
INTRAMUSCULAR | Status: DC | PRN
  Administered 2013-05-24: 30 mg via INTRAMUSCULAR
  Administered 2013-05-24: 30 mg via INTRAVENOUS

## 2013-05-24 MED ORDER — MEPERIDINE HCL 25 MG/ML IJ SOLN: 6.2500 mg | INTRAMUSCULAR | Status: DC | PRN

## 2013-05-24 SURGICAL SUPPLY — 48 items
APPLICATOR COTTON TIP 6IN STRL (MISCELLANEOUS) IMPLANT
BENZOIN TINCTURE PRP APPL 2/3 (GAUZE/BANDAGES/DRESSINGS) ×3 IMPLANT
CABLE HIGH FREQUENCY MONO STRZ (ELECTRODE) ×3 IMPLANT
CANISTER SUCTION 2500CC (MISCELLANEOUS) ×3 IMPLANT
CATH ROBINSON RED A/P 16FR (CATHETERS) IMPLANT
CLOTH BEACON ORANGE TIMEOUT ST (SAFETY) ×3 IMPLANT
CONT PATH 16OZ SNAP LID 3702 (MISCELLANEOUS) ×3 IMPLANT
DECANTER SPIKE VIAL GLASS SM (MISCELLANEOUS) IMPLANT
DERMABOND ADHESIVE PROPEN (GAUZE/BANDAGES/DRESSINGS) ×1
DERMABOND ADVANCED (GAUZE/BANDAGES/DRESSINGS)
DERMABOND ADVANCED .7 DNX12 (GAUZE/BANDAGES/DRESSINGS) IMPLANT
DERMABOND ADVANCED .7 DNX6 (GAUZE/BANDAGES/DRESSINGS) ×2 IMPLANT
ELECT LIGASURE LONG (ELECTRODE) IMPLANT
FORCEPS CUTTING 33CM 5MM (CUTTING FORCEPS) IMPLANT
FORCEPS CUTTING 45CM 5MM (CUTTING FORCEPS) IMPLANT
GAUZE SPONGE 4X4 16PLY XRAY LF (GAUZE/BANDAGES/DRESSINGS) ×3 IMPLANT
GLOVE BIO SURGEON STRL SZ 6.5 (GLOVE) ×3 IMPLANT
GLOVE BIOGEL PI IND STRL 7.0 (GLOVE) ×2 IMPLANT
GLOVE BIOGEL PI INDICATOR 7.0 (GLOVE) ×1
GOWN PREVENTION PLUS LG XLONG (DISPOSABLE) ×9 IMPLANT
NEEDLE HYPO 25X1 1.5 SAFETY (NEEDLE) IMPLANT
NEEDLE INSUFFLATION 120MM (ENDOMECHANICALS) ×3 IMPLANT
NS IRRIG 1000ML POUR BTL (IV SOLUTION) ×3 IMPLANT
PACK ABDOMINAL GYN (CUSTOM PROCEDURE TRAY) IMPLANT
PACK LAPAROSCOPY BASIN (CUSTOM PROCEDURE TRAY) ×3 IMPLANT
PAD OB MATERNITY 4.3X12.25 (PERSONAL CARE ITEMS) ×3 IMPLANT
PROTECTOR NERVE ULNAR (MISCELLANEOUS) ×3 IMPLANT
SCISSORS LAP 5X35 DISP (ENDOMECHANICALS) IMPLANT
SET IRRIG TUBING LAPAROSCOPIC (IRRIGATION / IRRIGATOR) ×3 IMPLANT
SOLUTION ELECTROLUBE (MISCELLANEOUS) IMPLANT
SPONGE LAP 18X18 X RAY DECT (DISPOSABLE) ×6 IMPLANT
STAPLER VISISTAT 35W (STAPLE) ×3 IMPLANT
STRIP CLOSURE SKIN 1/2X4 (GAUZE/BANDAGES/DRESSINGS) ×3 IMPLANT
SUT PLAIN 2 0 XLH (SUTURE) IMPLANT
SUT PROLENE 0 CT 1 30 (SUTURE) IMPLANT
SUT VIC AB 0 CT1 18XCR BRD8 (SUTURE) IMPLANT
SUT VIC AB 0 CT1 36 (SUTURE) ×6 IMPLANT
SUT VIC AB 0 CT1 8-18 (SUTURE)
SUT VICRYL 0 UR6 27IN ABS (SUTURE) ×3 IMPLANT
SUT VICRYL 4-0 PS2 18IN ABS (SUTURE) ×3 IMPLANT
SYR CONTROL 10ML LL (SYRINGE) IMPLANT
TOWEL OR 17X24 6PK STRL BLUE (TOWEL DISPOSABLE) ×6 IMPLANT
TRAY FOLEY CATH 14FR (SET/KITS/TRAYS/PACK) ×3 IMPLANT
TROCAR BALLN 12MMX100 BLUNT (TROCAR) ×3 IMPLANT
TROCAR OPTI TIP 5M 100M (ENDOMECHANICALS) ×6 IMPLANT
TROCAR XCEL DIL TIP R 11M (ENDOMECHANICALS) ×3 IMPLANT
WARMER LAPAROSCOPE (MISCELLANEOUS) ×3 IMPLANT
WATER STERILE IRR 1000ML POUR (IV SOLUTION) ×3 IMPLANT

## 2013-05-24 NOTE — MAU Note (Signed)
Patient transferred from [facility] via [facility] for further evaluation of abdominal pain and fever. Patient had surgery to remove fibroids 9/26. Patient reports a fever Friday night around 7pm, was unable to take temperature but felt very hot.  Saturday morning temperature was 100.4. Patient also complains of constant stabbing abdominal pain, burning with urination and nausea. Went to [facility] ED to be evaluated but left due to the wait and went to [place]. Patient was given 2 doses of morphine but pain remains a 10/10.

## 2013-05-24 NOTE — Brief Op Note (Signed)
05/24/2013  8:10 AM  PATIENT:  Victoria [name]  42 y.o. female  PRE-OPERATIVE DIAGNOSIS:  abdominal pain r/o uterine perforation, s/p fibroid resection 05/22/2013  POST-OPERATIVE DIAGNOSIS:  abdominal pain, s/p fibroid resection 05/22/2013/ruptured / ovarian cyst/adhesions  PROCEDURE:  Procedure(s): LAPAROSCOPY DIAGNOSTIC Natalia Leatherwood of adhesions/ evacuation of hemoperitoneium (N/A)  SURGEON:  Surgeon(s) and Role:    * Anise Harbin Cathie Beams, MD - Primary  PHYSICIAN ASSISTANT:   ASSISTANTS: none   ANESTHESIA:   general  EBL:  Total I/O In: 1000 [I.V.:1000] Out: 1125 [Urine:1100; Blood:25]  BLOOD ADMINISTERED:none  DRAINS: none   LOCAL MEDICATIONS USED:  MARCAINE     SPECIMEN:  No Specimen  DISPOSITION OF SPECIMEN:  N/A  COUNTS:  YES  TOURNIQUET:  * No tourniquets in log *  DICTATION: .Other Dictation: Dictation Number [id number]  PLAN OF CARE: extended same day observation and d/c home  PATIENT DISPOSITION:  PACU - hemodynamically stable.   Delay start of Pharmacological VTE agent (>24hrs) due to surgical blood loss or risk of bleeding: no

## 2013-05-24 NOTE — Progress Notes (Signed)
Discharge instructions reviewed with patient.  Patient states understanding of home care, activity, medications, signs/symptoms to report to MD and return MD office visit.  No home equipment needed.  Patient ambulated for discharge in stable condition with staff without incident. 

## 2013-05-24 NOTE — Anesthesia Postprocedure Evaluation (Signed)
Anesthesia Post Note  Patient: Victoria [name]  Procedure(s) Performed: Procedure(s) (LRB): LAPAROSCOPY DIAGNOSTIC Natalia Leatherwood of adhesions/ evacuation of hemoperitoneium (N/A)  Anesthesia type: General  Patient location: PACU  Post pain: Pain level controlled  Post assessment: Post-op Vital signs reviewed  Last Vitals: BP 113/69  Pulse 81  Temp(Src) 37 C (Oral)  Resp 16  Ht 5\' 9"  (1.753 m)  Wt 231 lb (104.781 kg)  BMI 34.1 kg/m2  SpO2 95%  LMP 05/14/2013  Post vital signs: Reviewed  Level of consciousness: sedated  Complications: No apparent anesthesia complications

## 2013-05-24 NOTE — Transfer of Care (Signed)
Immediate Anesthesia Transfer of Care Note  Patient: Victoria [name]  Procedure(s) Performed: Procedure(s): LAPAROSCOPY DIAGNOSTIC Natalia Leatherwood of adhesions/ evacuation of hemoperitoneium (N/A)  Patient Location: PACU  Anesthesia Type:General  Level of Consciousness: awake  Airway & Oxygen Therapy: Patient Spontanous Breathing and Patient connected to nasal cannula oxygen  Post-op Assessment: Report given to PACU RN and Post -op Vital signs reviewed and stable  Post vital signs: Reviewed and stable  Complications: No apparent anesthesia complications

## 2013-05-24 NOTE — Anesthesia Preprocedure Evaluation (Addendum)
Anesthesia Evaluation  Patient identified by MRN, date of birth, ID band Patient awake    Reviewed: Allergy & Precautions, H&P , NPO status , Patient's Chart, lab work & pertinent test results  Airway Mallampati: II TM Distance: >3 FB Neck ROM: full    Dental no notable dental hx. (+) Teeth Intact   Pulmonary former smoker,    Pulmonary exam normal       Cardiovascular hypertension, Pt. on medications     Neuro/Psych negative neurological ROS  negative psych ROS   GI/Hepatic negative GI ROS, Neg liver ROS,   Endo/Other  diabetes, Type 2, Oral Hypoglycemic Agents  Renal/GU negative Renal ROS  negative genitourinary   Musculoskeletal negative musculoskeletal ROS (+)   Abdominal (+) + obese,   Peds  Hematology negative hematology ROS (+)   Anesthesia Other Findings   Reproductive/Obstetrics negative OB ROS                           Anesthesia Physical  Anesthesia Plan  ASA: III and emergent  Anesthesia Plan: General   Post-op Pain Management:    Induction: Intravenous, Rapid sequence and Cricoid pressure planned  Airway Management Planned: Oral ETT  Additional Equipment:   Intra-op Plan:   Post-operative Plan:   Informed Consent: I have reviewed the patients History and Physical, chart, labs and discussed the procedure including the risks, benefits and alternatives for the proposed anesthesia with the patient or authorized representative who has indicated his/her understanding and acceptance.   Dental advisory given  Plan Discussed with: CRNA and Surgeon  Anesthesia Plan Comments:        Anesthesia Quick Evaluation

## 2013-05-24 NOTE — H&P (Signed)
Victoria [name] is an 42 y.o. female. G3P0030 SBF s/p hysteroscopic resection of SM fibroid 9/26 presents with c/o abdominal pain since 7-8 pm Friday night. Pt reports going home and having felt constipated took metformin and other med and had several bM. Pt  Then started with abdominal pain, felt she had a fever but did not have a thermometer and also had n/v. Pt noted burning with urination since surgery. Pt took tylenol and had BM again yesterday but had abdominal pain and went to [facility] where she was noted to be afebrile,  wbc 15k, hgb 10 and CT scan showed mod fluid in abdomen. U/a large blood Pt has had min vaginal bleeding  PMH Menstrual History: Menarche age:  Patient's last menstrual period was 05/14/2013.    Past Medical History  Diagnosis Date  . Hypertension   . Diabetes mellitus without complication   . Asthma   . Gallstone     stone in  gallbladder  . Multiple body piercings 02-05-13  . High cholesterol     Past Surgical History  Procedure Laterality Date  . Foot surgery Left 2000  . Ectopic pregnancy surgery  2001    left tube removed  . Cholecystectomy N/A 02/16/2013    Procedure: LAPAROSCOPIC CHOLECYSTECTOMY WITH INTRAOPERATIVE CHOLANGIOGRAM;  Surgeon: Wilmon Arms. Corliss Skains, MD;  Location: [facility];  Service: General;  Laterality: N/A;  hysteroscopic SM fibroid resection  Family History  Problem Relation Age of Onset  . Cancer Paternal Grandmother     breast  . Cancer Paternal Grandfather     lung    Social History:  reports that she quit smoking about 9 months ago. She has never used smokeless tobacco. She reports that she uses illicit drugs (Marijuana). She reports that she does not drink alcohol.  Allergies:  Allergies  Allergen Reactions  . Codeine Other (See Comments)    "messes with my heartbeat"  . Penicillins Other (See Comments)    "messes with my heartbeat"    Prescriptions prior to admission  Medication Sig Dispense Refill  .  Cholecalciferol (D3-1000) 1000 UNITS capsule Take 2,000 Units by mouth daily.      Marland Kitchen ibuprofen (ADVIL,MOTRIN) 800 MG tablet Take 1 tablet (800 mg total) by mouth every 8 (eight) hours as needed for pain.  30 tablet  0  . Linaclotide (LINZESS) 145 MCG CAPS capsule Take 145 mcg by mouth daily.      . metFORMIN (GLUCOPHAGE) 500 MG tablet Take 500 mg by mouth 2 (two) times daily.      . naphazoline-pheniramine (NAPHCON-A) 0.025-0.3 % ophthalmic solution Place 1 drop into both eyes 4 (four) times daily as needed (dry eye).      . naproxen sodium (ANAPROX) 220 MG tablet Take 220 mg by mouth daily as needed (headache).       . Olmesartan-Amlodipine-HCTZ (TRIBENZOR) 20-5-12.5 MG TABS Take 1 tablet by mouth daily.        Review of Systems  Constitutional: Positive for fever.  Gastrointestinal: Positive for abdominal pain. Negative for nausea and vomiting.  Genitourinary: Positive for dysuria.    Blood pressure 137/87, pulse 86, temperature 99.8 F (37.7 C), temperature source Oral, resp. rate 20, height 5\' 9"  (1.753 m), weight 104.781 kg (231 lb), last menstrual period 05/14/2013. Physical Exam  Constitutional: She is oriented to person, place, and time. She appears well-developed. She appears distressed.  HENT:  Head: Atraumatic.  Eyes: EOM are normal.  Neck: Neck supple.  Cardiovascular: Regular rhythm.  Murmur heard. Respiratory: Breath sounds normal.  GI: Bowel sounds are normal. There is tenderness. There is no rebound.  Musculoskeletal: She exhibits no edema.  Neurological: She is alert and oriented to person, place, and time.  Skin: Skin is warm and dry.  pelvic exam deferred  No results found for this or any previous visit (from the past 24 hour(s)).  No results found.  Assessment/Plan: Abdominal pain s/p hysteroscopic fibroid resection r/o perforation Dysuria P) admit to OR for dx laparoscopy , poss laparotomy.  Surgical risk reviewed including infection, bleeding, injury to  underlying organ structures Ciprofloxacin/clindamycin  [name] 05/24/2013, 5:26 AM

## 2013-05-24 NOTE — Progress Notes (Addendum)
S: feels much better. Min abdominal pain  Voided w/o problem  VSS Afebrile  Lungs clear to A Cor RRR grade 2/6 SEM Abd soft obese nondistended (+) BS incisions well approximated Pad no blood  IMP: S/P laparoscopy doing well Presumed UTI based on u/a( done in [place])  And sx of dysuria P) d/c home  cipro  500mg  po bid x 3 days F/u office Fri OOW until ov Asked about staying due to no one at home rather than because of any sx. Pt advised cannot keep as no indication. Had not d/c her from PACU and observed her already

## 2013-05-25 ENCOUNTER — Encounter (HOSPITAL_COMMUNITY): Payer: Self-pay | Admitting: Obstetrics and Gynecology

## 2013-05-25 LAB — GLUCOSE, CAPILLARY: Glucose-Capillary: 98 mg/dL (ref 70–99)

## 2013-05-25 NOTE — Op Note (Signed)
NAME:  Victoria [name], Victoria [name]                ACCOUNT NO.:  [id number]  MEDICAL RECORD NO.:  [id number]  LOCATION:  9305                          FACILITY:  [facility]  PHYSICIAN:  Maxie Better, M.D.DATE OF BIRTH:  11/18/1970  DATE OF PROCEDURE:  05/24/2013 DATE OF DISCHARGE:  05/24/2013                              OPERATIVE REPORT   PREOPERATIVE DIAGNOSIS:  Abdominal pain, rule out uterine perforation.  POSTOPERATIVE DIAGNOSES:  Abdominal pain, ruptured ovarian cyst, and hemoperitoneum.  PROCEDURE:  Operative/diagnostic laparoscopy, lysis of adhesions, and evacuation of hemoperitoneum.  ANESTHESIA:  General.  SURGEON:  Maxie Better, M.D.  ASSISTANT:  None.  PROCEDURE IN DETAIL:  Under adequate general anesthesia, the patient was placed in a dorsal lithotomy position.  She was sterilely prepped and draped in usual fashion.  Indwelling Foley catheter was sterilely placed.  Examination under anesthesia revealed scant blood in the vagina.  Uterus was anteverted.  No adnexal masses could be appreciated. Attention was then turned to the abdomen.  A 0.25% Marcaine was injected along the previous infraumbilical scar.  The patient had 2 incisions present.  The most proximal was opened and ultimately, the fascia was identified and opened.  Pursestring suture was placed and the Hasson cannula was introduced.  A lighted video laparoscope was introduced through that port.  The abdomen was insufflated.  There was evidence of omental adhesion to the left of the incision from her prior surgeries.  There was blood in the pelvis on the right side of the abdomen.  The liver edge was normal, no blood noted.  A second incision was placed suprapubically along the prior Pfannenstiel skin incision and under direct visualization, a 5-mm port was placed.  Using the Nezhat apparatus, the abdomen was irrigated and suctioned off the clotted material.  Uterus appeared normal.  The left side of the uterus  were had adhesions that was thin, extended from the left anterior wall of the uterus and lateral wall extending up to the anterior abdominal wall. These adhesions were lysed  On the right side, there was blood below the fallopian tube and right ovary extending towards the mesosalpinx.  No active bleeding was subsequently noted. The clots were removed.  The ovary inspected.  The tube appeared other than having some blood in the mesosalpinx area, was without any other issue.  The posterior cul-de-sac was irrigated and suctioned.  The upper abdomen was inspected.  Hot scissors were then used to lyse those adhesions off the anterior wall on the left side pertaining to the uterus.  Once this was done and good hemostasis was noted throughout, the procedure was terminated by removing the suprapubic site, removing the infraumbilical site under direct visualization, deflating the abdomen, and closing the pursestring and the incisions with 4-0 Vicryl subcuticular sutures.  The instruments in the vagina were removed. Pictures taken did not develop on the new camera.  SPECIMENS:  None.  ESTIMATED BLOOD LOSS:  Minimal for the surgery.  COMPLICATIONS:  None.  INTRAOPERATIVE FLUID:  1200 mL.  URINE OUTPUT:  1100 mL.  COUNTS:  Sponge and instrument counts x2 was correct.  The patient tolerated the procedure well, was transferred to recovery in stable condition.  Maxie Better, M.D.     /MEDQ  D:  05/24/2013  T:  05/25/2013  Job:  [id number]

## 2013-05-26 ENCOUNTER — Encounter (HOSPITAL_COMMUNITY): Payer: Self-pay | Admitting: Obstetrics and Gynecology

## 2013-07-08 ENCOUNTER — Encounter (HOSPITAL_COMMUNITY): Payer: Self-pay

## 2013-07-08 ENCOUNTER — Encounter (HOSPITAL_COMMUNITY)
Admission: RE | Admit: 2013-07-08 | Discharge: 2013-07-08 | Disposition: A | Discharge status: Home / Self Care | Payer: BC Managed Care – PPO | Source: Ambulatory Visit | Attending: Obstetrics and Gynecology | Admitting: Obstetrics and Gynecology

## 2013-07-08 DIAGNOSIS — Z01812 Encounter for preprocedural laboratory examination: Secondary | ICD-10-CM | POA: Insufficient documentation

## 2013-07-08 LAB — CBC
HCT: 36.3 % (ref 36.0–46.0)
Hemoglobin: 12.1 g/dL (ref 12.0–15.0)
MCH: 26.4 pg (ref 26.0–34.0)
MCHC: 33.3 g/dL (ref 30.0–36.0)
MCV: 79.3 fL (ref 78.0–100.0)
RDW: 13.7 % (ref 11.5–15.5)

## 2013-07-08 LAB — BASIC METABOLIC PANEL
BUN: 16 mg/dL (ref 6–23)
Calcium: 9.3 mg/dL (ref 8.4–10.5)
Creatinine, Ser: 0.79 mg/dL (ref 0.50–1.10)
GFR calc Af Amer: >90 mL/min (ref >90)
GFR calc non Af Amer: >90 mL/min (ref >90)
Glucose, Bld: 106 mg/dL — ABNORMAL HIGH (ref 70–99)
Potassium: 3.5 mEq/L (ref 3.5–5.1)

## 2013-07-08 NOTE — Pre-Procedure Instructions (Signed)
EKG approved by Cristela Blue, MD

## 2013-07-08 NOTE — Patient Instructions (Signed)
20 Victoria [name]  07/08/2013   Your procedure is scheduled on:  07/15/13  Enter through the Main Entrance of [facility] at 11 AM.  Pick up the phone at the desk and dial [phone].   Call this number if you have problems the morning of surgery: [phone]   Remember:   Do not eat food:after midnight.  Do not drink clear liquids: 4 Hours before arrival.  Take these medicines the morning of surgery with A SIP OF WATER: Blood pressure medication, bring inhaler   Do not wear jewelry, make-up or nail polish.  Do not wear lotions, powders, or perfumes. You may wear deodorant.  Do not shave 48 hours prior to surgery.  Do not bring valuables to the hospital.  [facility] is not   responsible for any belongings or valuables brought to the hospital.  Contacts, dentures or bridgework may not be worn into surgery.  Leave suitcase in the car. After surgery it may be brought to your room.  For patients admitted to the hospital, checkout time is 11:00 AM the day of              discharge.   Patients discharged the day of surgery will not be allowed to drive             home.  Name and phone number of your driver: Chalmers Guest  Special Instructions:   Shower using CHG 2 nights before surgery and the night before surgery.  If you shower the day of surgery use CHG.  Use special wash - you have one bottle of CHG for all showers.  You should use approximately 1/3 of the bottle for each shower.   Please read over the following fact sheets that you were given:   Surgical Site Infection Prevention

## 2013-07-15 ENCOUNTER — Encounter (HOSPITAL_COMMUNITY): Payer: Self-pay | Admitting: Anesthesiology

## 2013-07-15 ENCOUNTER — Encounter (HOSPITAL_COMMUNITY): Payer: BC Managed Care – PPO | Admitting: Anesthesiology

## 2013-07-15 ENCOUNTER — Ambulatory Visit (HOSPITAL_COMMUNITY): Payer: BC Managed Care – PPO

## 2013-07-15 ENCOUNTER — Encounter (HOSPITAL_COMMUNITY)
Admission: RE | Disposition: A | Discharge status: Home / Self Care | Payer: Self-pay | Source: Ambulatory Visit | Attending: Obstetrics and Gynecology

## 2013-07-15 ENCOUNTER — Ambulatory Visit (HOSPITAL_COMMUNITY): Payer: BC Managed Care – PPO | Admitting: Anesthesiology

## 2013-07-15 ENCOUNTER — Ambulatory Visit (HOSPITAL_COMMUNITY)
Admission: RE | Admit: 2013-07-15 | Discharge: 2013-07-15 | Disposition: A | Discharge status: Home / Self Care | Payer: BC Managed Care – PPO | Source: Ambulatory Visit | Attending: Obstetrics and Gynecology | Admitting: Obstetrics and Gynecology

## 2013-07-15 DIAGNOSIS — D250 Submucous leiomyoma of uterus: Secondary | ICD-10-CM | POA: Insufficient documentation

## 2013-07-15 DIAGNOSIS — Z87891 Personal history of nicotine dependence: Secondary | ICD-10-CM | POA: Insufficient documentation

## 2013-07-15 HISTORY — PX: HYSTEROSCOPY: SHX211

## 2013-07-15 LAB — PREGNANCY, URINE: Preg Test, Ur: NEGATIVE

## 2013-07-15 LAB — BASIC METABOLIC PANEL
BUN: 10 mg/dL (ref 6–23)
CO2: 25 mEq/L (ref 19–32)
Chloride: 99 mEq/L (ref 96–112)
GFR calc Af Amer: >90 mL/min (ref >90)
GFR calc non Af Amer: >90 mL/min (ref >90)
Glucose, Bld: 117 mg/dL — ABNORMAL HIGH (ref 70–99)
Potassium: 3.2 mEq/L — ABNORMAL LOW (ref 3.5–5.1)
Sodium: 133 mEq/L — ABNORMAL LOW (ref 135–145)

## 2013-07-15 LAB — GLUCOSE, CAPILLARY: Glucose-Capillary: 94 mg/dL (ref 70–99)

## 2013-07-15 SURGERY — HYSTEROSCOPY
Anesthesia: General | Site: Uterus | Wound class: Clean Contaminated

## 2013-07-15 MED ORDER — FENTANYL CITRATE 0.05 MG/ML IJ SOLN
INTRAMUSCULAR | Status: DC | PRN
  Administered 2013-07-15: 100 ug via INTRAVENOUS
  Administered 2013-07-15 (×4): 25 ug via INTRAVENOUS

## 2013-07-15 MED ORDER — ROCURONIUM BROMIDE 100 MG/10ML IV SOLN
INTRAVENOUS | Status: AC
  Filled 2013-07-15: qty 1

## 2013-07-15 MED ORDER — LIDOCAINE HCL (CARDIAC) 20 MG/ML IV SOLN
INTRAVENOUS | Status: AC
  Filled 2013-07-15: qty 5

## 2013-07-15 MED ORDER — METOCLOPRAMIDE HCL 5 MG/ML IJ SOLN: 10.0000 mg | Freq: Once | INTRAMUSCULAR | Status: DC | PRN

## 2013-07-15 MED ORDER — IOHEXOL 300 MG/ML  SOLN: 30.0000 mL | Freq: Once | INTRAMUSCULAR | Status: DC | PRN

## 2013-07-15 MED ORDER — PROPOFOL 10 MG/ML IV EMUL
INTRAVENOUS | Status: AC
  Filled 2013-07-15: qty 20

## 2013-07-15 MED ORDER — ALBUTEROL SULFATE HFA 108 (90 BASE) MCG/ACT IN AERS
2.0000 | INHALATION_SPRAY | RESPIRATORY_TRACT | Status: DC
  Filled 2013-07-15: qty 6.7

## 2013-07-15 MED ORDER — ESMOLOL HCL 10 MG/ML IV SOLN
INTRAVENOUS | Status: DC | PRN
  Administered 2013-07-15 (×3): 10 mg via INTRAVENOUS

## 2013-07-15 MED ORDER — DEXAMETHASONE SODIUM PHOSPHATE 10 MG/ML IJ SOLN
INTRAMUSCULAR | Status: AC
  Filled 2013-07-15: qty 1

## 2013-07-15 MED ORDER — GLYCOPYRROLATE 0.2 MG/ML IJ SOLN
INTRAMUSCULAR | Status: DC | PRN
  Administered 2013-07-15 (×2): 0.1 mg via INTRAVENOUS

## 2013-07-15 MED ORDER — ESMOLOL HCL 10 MG/ML IV SOLN
INTRAVENOUS | Status: AC
  Filled 2013-07-15: qty 10

## 2013-07-15 MED ORDER — PROPOFOL 10 MG/ML IV BOLUS
INTRAVENOUS | Status: DC | PRN
  Administered 2013-07-15: 200 mg via INTRAVENOUS

## 2013-07-15 MED ORDER — SODIUM CHLORIDE 0.9 % IJ SOLN
INTRAMUSCULAR | Status: DC | PRN
  Administered 2013-07-15: 50 mL via INTRAVENOUS

## 2013-07-15 MED ORDER — GLYCOPYRROLATE 0.2 MG/ML IJ SOLN
INTRAMUSCULAR | Status: AC
  Filled 2013-07-15: qty 3

## 2013-07-15 MED ORDER — IOHEXOL 300 MG/ML  SOLN
INTRAMUSCULAR | Status: DC | PRN
  Administered 2013-07-15: 30 mL

## 2013-07-15 MED ORDER — KETOROLAC TROMETHAMINE 30 MG/ML IJ SOLN
INTRAMUSCULAR | Status: DC | PRN
  Administered 2013-07-15: 30 mg via INTRAVENOUS

## 2013-07-15 MED ORDER — NEOSTIGMINE METHYLSULFATE 1 MG/ML IJ SOLN
INTRAMUSCULAR | Status: AC
  Filled 2013-07-15: qty 1

## 2013-07-15 MED ORDER — SODIUM CHLORIDE 0.9 % IJ SOLN
INTRAMUSCULAR | Status: AC
  Filled 2013-07-15: qty 100

## 2013-07-15 MED ORDER — ONDANSETRON HCL 4 MG/2ML IJ SOLN
INTRAMUSCULAR | Status: AC
  Filled 2013-07-15: qty 2

## 2013-07-15 MED ORDER — VASOPRESSIN 20 UNIT/ML IJ SOLN
INTRAVENOUS | Status: DC | PRN
  Administered 2013-07-15: 13:00:00 via INTRAMUSCULAR

## 2013-07-15 MED ORDER — ONDANSETRON HCL 4 MG/2ML IJ SOLN
INTRAMUSCULAR | Status: DC | PRN
  Administered 2013-07-15: 4 mg via INTRAVENOUS

## 2013-07-15 MED ORDER — FENTANYL CITRATE 0.05 MG/ML IJ SOLN
25.0000 ug | INTRAMUSCULAR | Status: DC | PRN
  Administered 2013-07-15 (×4): 25 ug via INTRAVENOUS

## 2013-07-15 MED ORDER — LACTATED RINGERS IV SOLN
INTRAVENOUS | Status: DC
  Administered 2013-07-15: 125 mL/h via INTRAVENOUS
  Administered 2013-07-15: 13:00:00 via INTRAVENOUS

## 2013-07-15 MED ORDER — CEFAZOLIN SODIUM-DEXTROSE 2-3 GM-% IV SOLR
INTRAVENOUS | Status: AC
  Filled 2013-07-15: qty 50

## 2013-07-15 MED ORDER — MIDAZOLAM HCL 2 MG/2ML IJ SOLN
INTRAMUSCULAR | Status: AC
  Filled 2013-07-15: qty 2

## 2013-07-15 MED ORDER — MEPERIDINE HCL 25 MG/ML IJ SOLN: 6.2500 mg | INTRAMUSCULAR | Status: DC | PRN

## 2013-07-15 MED ORDER — KETOROLAC TROMETHAMINE 30 MG/ML IJ SOLN
INTRAMUSCULAR | Status: AC
  Filled 2013-07-15: qty 1

## 2013-07-15 MED ORDER — FENTANYL CITRATE 0.05 MG/ML IJ SOLN
INTRAMUSCULAR | Status: AC
  Filled 2013-07-15: qty 5

## 2013-07-15 MED ORDER — VASOPRESSIN 20 UNIT/ML IJ SOLN
INTRAMUSCULAR | Status: AC
  Filled 2013-07-15: qty 1

## 2013-07-15 MED ORDER — GENTAMICIN SULFATE 40 MG/ML IJ SOLN
INTRAVENOUS | Status: AC
  Administered 2013-07-15: 100 mL via INTRAVENOUS
  Filled 2013-07-15: qty 9.75

## 2013-07-15 MED ORDER — MIDAZOLAM HCL 2 MG/2ML IJ SOLN
INTRAMUSCULAR | Status: DC | PRN
  Administered 2013-07-15: 1 mg via INTRAVENOUS

## 2013-07-15 MED ORDER — KETOROLAC TROMETHAMINE 30 MG/ML IJ SOLN: 15.0000 mg | Freq: Once | INTRAMUSCULAR | Status: DC | PRN

## 2013-07-15 MED ORDER — GLYCOPYRROLATE 0.2 MG/ML IJ SOLN
INTRAMUSCULAR | Status: AC
  Filled 2013-07-15: qty 1

## 2013-07-15 MED ORDER — FENTANYL CITRATE 0.05 MG/ML IJ SOLN
INTRAMUSCULAR | Status: AC
  Filled 2013-07-15: qty 2

## 2013-07-15 MED ORDER — LIDOCAINE HCL (CARDIAC) 20 MG/ML IV SOLN
INTRAVENOUS | Status: DC | PRN
  Administered 2013-07-15: 80 mg via INTRAVENOUS

## 2013-07-15 SURGICAL SUPPLY — 30 items
CANISTER SUCT 3000ML (MISCELLANEOUS) ×2 IMPLANT
CANNULA CURETTE W/SYR 6 (CANNULA) IMPLANT
CANNULA CURETTE W/SYR 7 (CANNULA) IMPLANT
CATH ROBINSON RED A/P 16FR (CATHETERS) ×2 IMPLANT
CLOTH BEACON ORANGE TIMEOUT ST (SAFETY) ×2 IMPLANT
CONTAINER PREFILL 10% NBF 60ML (FORM) ×4 IMPLANT
CORD ACTIVE DISPOSABLE (ELECTRODE)
CORD ELECTRO ACTIVE DISP (ELECTRODE) IMPLANT
DRAPE C-ARM 42X72 X-RAY (DRAPES) IMPLANT
DRESSING TELFA 8X3 (GAUZE/BANDAGES/DRESSINGS) ×2 IMPLANT
ELECT LOOP GYNE PRO 24FR (CUTTING LOOP)
ELECT REM PT RETURN 9FT ADLT (ELECTROSURGICAL)
ELECT VAPORTRODE GRVD BAR (ELECTRODE) IMPLANT
ELECTRODE LOOP GYNE PRO 24FR (CUTTING LOOP) IMPLANT
ELECTRODE REM PT RTRN 9FT ADLT (ELECTROSURGICAL) IMPLANT
ELECTRODE ROLLER VERSAPOINT (ELECTRODE) IMPLANT
ELECTRODE RT ANGLE VERSAPOINT (CUTTING LOOP) IMPLANT
GLOVE BIO SURGEON STRL SZ8 (GLOVE) ×2 IMPLANT
GLOVE BIOGEL PI IND STRL 8.5 (GLOVE) ×1 IMPLANT
GLOVE BIOGEL PI INDICATOR 8.5 (GLOVE) ×1
GOWN STRL REIN XL XLG (GOWN DISPOSABLE) ×4 IMPLANT
LOOP ANGLED CUTTING 22FR (CUTTING LOOP) IMPLANT
PACK HYSTEROSCOPY LF (CUSTOM PROCEDURE TRAY) ×2 IMPLANT
PAD OB MATERNITY 4.3X12.25 (PERSONAL CARE ITEMS) ×2 IMPLANT
SEPRAFILM MEMBRANE 5X6 (MISCELLANEOUS) ×2 IMPLANT
STENT BALLN UTERINE 3CM 6FR (Stent) IMPLANT
STENT BALLN UTERINE 4CM 6FR (STENTS) IMPLANT
SUT SILK 2 0 SH (SUTURE) IMPLANT
TOWEL OR 17X24 6PK STRL BLUE (TOWEL DISPOSABLE) ×4 IMPLANT
WATER STERILE IRR 1000ML POUR (IV SOLUTION) ×2 IMPLANT

## 2013-07-15 NOTE — Transfer of Care (Signed)
Immediate Anesthesia Transfer of Care Note  Patient: Victoria [name]  Procedure(s) Performed: Procedure(s): HYSTEROSCOPY MONOPOLAR RESECTION OF RESIDUAL SUBMUCOSAL MYOMA WITH SLENDER RESECTOSCOPE/INTEROP HSG   INSERTION OF SEPRA FILM STENT WITH C- ARM LYSIS OF INTRA UTERINE ADHESIONS AND ULTRASOUND (N/A)  Patient Location: PACU  Anesthesia Type:General  Level of Consciousness: awake, oriented and patient cooperative  Airway & Oxygen Therapy: Patient Spontanous Breathing and Patient connected to nasal cannula oxygen  Post-op Assessment: Report given to PACU RN and Post -op Vital signs reviewed and stable  Post vital signs: Reviewed and stable  Complications: No apparent anesthesia complications

## 2013-07-15 NOTE — H&P (Signed)
Victoria [name] is an 42 y.o. female G: 4 P 0 (SAB 3 Ect 1) with a residual submucosal myoma after an attempt at H/S resection on 05/22/13. She has had 3 SAB's and wishes to preserve childbearing potential.  Pertinent Gynecological History: Menses: flow is moderate Bleeding: regular Contraception: none DES exposure: denies Blood transfusions: none Sexually transmitted diseases: past history: GC Previous GYN Procedures: H/S, sm myoma resection in 9/14 and subsequent L/S for pain  Last mammogram: normal Date: 7/14 Last pap: normal Date: 9/14 OB History: G 4, P 0040   Menstrual History: Menarche age: 52  No LMP recorded.    Past Medical History  Diagnosis Date  . Hypertension   . Diabetes mellitus without complication   . Asthma   . Gallstone     stone in  gallbladder  . Multiple body piercings 02-05-13  . High cholesterol   . S/P laparoscopy (9/28) 05/24/2013    Past Surgical History  Procedure Laterality Date  . Foot surgery Left 2000  . Ectopic pregnancy surgery  2001    left tube removed  . Cholecystectomy N/A 02/16/2013    Procedure: LAPAROSCOPIC CHOLECYSTECTOMY WITH INTRAOPERATIVE CHOLANGIOGRAM;  Surgeon: Wilmon Arms. Corliss Skains, MD;  Location: [facility];  Service: General;  Laterality: N/A;  . Dilatation & currettage/hysteroscopy with resectocope N/A 05/22/2013    Procedure: DILATATION & CURETTAGE/HYSTEROSCOPY WITH RESECTOCOPE;  Surgeon: Serita Kyle, MD;  Location: [facility]S;  Service: Gynecology;  Laterality: N/A;  . Laparoscopy N/A 05/24/2013    Procedure: LAPAROSCOPY DIAGNOSTIC Natalia Leatherwood of adhesions/ evacuation of hemoperitoneium;  Surgeon: Serita Kyle, MD;  Location: [facility]S;  Service: Gynecology;  Laterality: N/A;  . Dilation and curettage of uterus      Family History  Problem Relation Age of Onset  . Cancer Paternal Grandmother     breast  . Cancer Paternal Grandfather     lung    Social History:  reports that she quit smoking about 11 months ago. She has  never used smokeless tobacco. She reports that she uses illicit drugs (Marijuana). She reports that she does not drink alcohol.  Allergies:  Allergies  Allergen Reactions  . Codeine Other (See Comments)    "messes with my heartbeat"  . Penicillins Other (See Comments)    "messes with my heartbeat"    No prescriptions prior to admission    Review of Systems  Constitutional: Negative.   Eyes: Negative.   Respiratory: Negative.   Cardiovascular: Negative.   Gastrointestinal: Negative.   Genitourinary: Negative.   Musculoskeletal: Negative.   Skin: Negative.   Endo/Heme/Allergies: Negative.   Psychiatric/Behavioral: Negative.     Height 5\' 9"  (1.753 m), weight 97.523 kg (215 lb). Physical Exam  Constitutional: She is oriented to person, place, and time. She appears well-developed and well-nourished.  HENT:  Head: Normocephalic and atraumatic.  Nose: Nose normal.  Mouth/Throat: Oropharynx is clear and moist. No oropharyngeal exudate.  Eyes: Conjunctivae normal and EOM are normal. Pupils are equal, round, and reactive to light. No scleral icterus.  Neck: Normal range of motion. Neck supple. No tracheal deviation present. No thyromegaly present.  Cardiovascular: Normal rate.   Respiratory: Effort normal and breath sounds normal.  GI: Soft. Bowel sounds are normal. She exhibits no distension and no mass. There is no tenderness.  Lymphadenopathy:    She has no cervical adenopathy.  Neurological: She is alert and oriented to person, place, and time. She has normal reflexes.  Skin: Skin is warm.  Psychiatric: She has  a normal mood and affect. Her behavior is normal. Judgment and thought content normal.   Saline SHG: 06/15/13  18 x 16 x 17 mm round Type I myoma with anterior superior attachment Assessment/Plan: Submucosal uterine myoma, causing RPL, and enorrhagia  Preoperative for rt H/S myoma resection Benefits and risks of H/S myomectomy were discussed with the patient.   All  of patient's questions were answered.  She verbalized understanding.    [facility] 07/15/2013, 10:43 AM

## 2013-07-15 NOTE — Anesthesia Preprocedure Evaluation (Addendum)
Anesthesia Evaluation  Patient identified by MRN, date of birth, ID band Patient awake    Reviewed: Allergy & Precautions, H&P , NPO status , Patient's Chart, lab work & pertinent test results, reviewed documented beta blocker date and time   History of Anesthesia Complications Negative for: history of anesthetic complications  Airway Mallampati: I TM Distance: >3 FB Neck ROM: full    Dental  (+) Teeth Intact   Pulmonary asthma (uses albuterol daily, no hospitalizations) , former smoker (quit 2 months ago),  breath sounds clear to auscultation  Pulmonary exam normal       Cardiovascular Exercise Tolerance: Good hypertension (156/103), On Medications Rhythm:regular Rate:Normal     Neuro/Psych  Headaches (migraines - occasional), negative psych ROS   GI/Hepatic negative GI ROS, (+)       marijuana use,   Endo/Other  diabetes, Type 2, Oral Hypoglycemic AgentsObese - BMI 32.3  Renal/GU negative Renal ROS  Female GU complaint     Musculoskeletal   Abdominal   Peds  Hematology negative hematology ROS (+)   Anesthesia Other Findings   Reproductive/Obstetrics negative OB ROS                          Anesthesia Physical Anesthesia Plan  ASA: III  Anesthesia Plan: General LMA   Post-op Pain Management:    Induction:   Airway Management Planned:   Additional Equipment:   Intra-op Plan:   Post-operative Plan:   Informed Consent: I have reviewed the patients History and Physical, chart, labs and discussed the procedure including the risks, benefits and alternatives for the proposed anesthesia with the patient or authorized representative who has indicated his/her understanding and acceptance.   Dental Advisory Given  Plan Discussed with: CRNA and Surgeon  Anesthesia Plan Comments:         Anesthesia Quick Evaluation

## 2013-07-15 NOTE — Anesthesia Postprocedure Evaluation (Signed)
  Anesthesia Post-op Note  Patient: Victoria [name]  Procedure(s) Performed: Procedure(s): HYSTEROSCOPY MONOPOLAR RESECTION OF RESIDUAL SUBMUCOSAL MYOMA WITH SLENDER RESECTOSCOPE/INTEROP HSG   INSERTION OF SEPRA FILM STENT WITH C- ARM LYSIS OF INTRA UTERINE ADHESIONS AND ULTRASOUND (N/A) Patient is awake and responsive. Pain and nausea are reasonably well controlled. Vital signs are stable and clinically acceptable. Oxygen saturation is clinically acceptable. There are no apparent anesthetic complications at this time. Patient is ready for discharge.

## 2013-07-15 NOTE — Op Note (Signed)
OPERATIVE NOTE  Preoperative diagnosis: Residual submucosal myoma, rule out tubal patency Postoperative diagnosis: Residual submucosal myoma, type 0, probable tubal patency, intrauterine adhesion Procedure: Hysteroscopy, lysis of adhesions, resection of residual submucosal myoma, intraoperative HSG and ultrasound, intrauterine placement of Seprafilm stent Anesthesia: Gen. Surgeon: Raeford Razor, MD Complications: None  Estimated blood loss: Less than 20 mL Specimens: Submucosal myoma fragments to pathology  Findings: Exam under anesthesia showed the uterus to be normal size and anteverted with normal mobility. No adnexal masses were palpable. On hysteroscopy, endocervical canal was normal. There was a midline dense adhesion in the lower uterine segment, creating the impression of a segment of septum. Preoperatively noted type 0 submucosal myoma, measuring about 1.9 cm, was noted to the right of midline near the fundus. The left tubal ostium was noted but the right tubal ostium could not be seen because of the wedging by the residual myoma. The myoma appeared degenerated and had adhesions to the right anterior endometrial wall. HSG performed intraoperatively was inconclusive in terms of patency of the right tube (the patient had previous left salpingectomy for ectopic pregnancy).  Description of procedure: Patient was given general anesthesia in lithotomy position. She was prepped and draped in sterile manner. Bladder was straight catheterized. Dilute vasopressin solution was injected into the cervix. Fluoroscopy was not available for a preoperative evaluation of the right tubal patency prior to instrumentation of the uterus therefore we proceeded with hysteroscopy. A 30 degree slender hysteroscope was used and video hysteroscopy was started. Above findings were noted. Distention medium was 1.5% glycine and the distention method was Lendell Caprice hysteroscopic fluid pump. Using hysteroscopic scissors the  lower uterine segment adhesion was sharply cut. Above findings were noted in the upper uterine cavity. We then switched to a Slimline resectoscope with a 21-gauge loop electrode, using cutting current of 90 W. In order to perform this the uterus was for sounded to 7 cm and it was dilated to 8 mm with Hegar dilators. The myoma was resected and the fragments were removed. A Cohen cannula was attached into the cervix and Omnipaque was injected for contrast. Tubal patency could not be evaluated because of extravasation of the contrast medium. An ultrasound was done to confirm the absence of residual myoma. A Seprafilm sheath was rolled around DeBakey forceps and trimmed at 7 cm. This was inserted into the uterine cavity to act as an adhesion barrier. The patient tolerated the procedure well and was transferred to recovery room in satisfactory condition. She will use Femara 7.5 mg for ovulation augmentation next cycle and we will perform a day 12 ultrasound. If needed, we will do an outpatient HSG to look into patency of the right tube.  Fermin Schwab, MD

## 2013-07-16 ENCOUNTER — Encounter (HOSPITAL_COMMUNITY): Payer: Self-pay | Admitting: Obstetrics and Gynecology

## 2019-05-17 ENCOUNTER — Emergency Department (HOSPITAL_COMMUNITY): Payer: Self-pay

## 2019-05-17 ENCOUNTER — Observation Stay (HOSPITAL_COMMUNITY)
Admission: EM | Admit: 2019-05-17 | Discharge: 2019-05-18 | Disposition: A | Discharge status: Home / Self Care | Payer: Self-pay | Attending: Internal Medicine | Admitting: Internal Medicine

## 2019-05-17 ENCOUNTER — Encounter (HOSPITAL_COMMUNITY): Payer: Self-pay | Admitting: Internal Medicine

## 2019-05-17 DIAGNOSIS — I160 Hypertensive urgency: Secondary | ICD-10-CM | POA: Diagnosis present

## 2019-05-17 DIAGNOSIS — R531 Weakness: Secondary | ICD-10-CM

## 2019-05-17 DIAGNOSIS — I252 Old myocardial infarction: Secondary | ICD-10-CM | POA: Insufficient documentation

## 2019-05-17 DIAGNOSIS — Z87891 Personal history of nicotine dependence: Secondary | ICD-10-CM | POA: Insufficient documentation

## 2019-05-17 DIAGNOSIS — D72829 Elevated white blood cell count, unspecified: Secondary | ICD-10-CM | POA: Insufficient documentation

## 2019-05-17 DIAGNOSIS — Z79899 Other long term (current) drug therapy: Secondary | ICD-10-CM | POA: Insufficient documentation

## 2019-05-17 DIAGNOSIS — I69351 Hemiplegia and hemiparesis following cerebral infarction affecting right dominant side: Secondary | ICD-10-CM | POA: Insufficient documentation

## 2019-05-17 DIAGNOSIS — I10 Essential (primary) hypertension: Secondary | ICD-10-CM | POA: Insufficient documentation

## 2019-05-17 DIAGNOSIS — R200 Anesthesia of skin: Secondary | ICD-10-CM | POA: Insufficient documentation

## 2019-05-17 DIAGNOSIS — I161 Hypertensive emergency: Principal | ICD-10-CM | POA: Insufficient documentation

## 2019-05-17 DIAGNOSIS — R2990 Unspecified symptoms and signs involving the nervous system: Secondary | ICD-10-CM

## 2019-05-17 DIAGNOSIS — Z20828 Contact with and (suspected) exposure to other viral communicable diseases: Secondary | ICD-10-CM | POA: Insufficient documentation

## 2019-05-17 HISTORY — DX: Cerebral infarction, unspecified: I63.9

## 2019-05-17 HISTORY — DX: Acute myocardial infarction, unspecified: I21.9

## 2019-05-17 HISTORY — DX: Essential (primary) hypertension: I10

## 2019-05-17 HISTORY — DX: Leiomyoma of uterus, unspecified: D25.9

## 2019-05-17 LAB — COMPREHENSIVE METABOLIC PANEL
ALT: 14 U/L (ref 0–44)
AST: 19 U/L (ref 15–41)
Albumin: 4.1 g/dL (ref 3.5–5.0)
Alkaline Phosphatase: 69 U/L (ref 38–126)
Anion gap: 10 (ref 5–15)
BUN: 15 mg/dL (ref 6–20)
CO2: 19 mmol/L — ABNORMAL LOW (ref 22–32)
Calcium: 9.1 mg/dL (ref 8.9–10.3)
Chloride: 107 mmol/L (ref 98–111)
Creatinine, Ser: 1.03 mg/dL — ABNORMAL HIGH (ref 0.44–1.00)
GFR calc Af Amer: >60 mL/min (ref >60)
GFR calc non Af Amer: >60 mL/min (ref >60)
Glucose, Bld: 91 mg/dL (ref 70–99)
Potassium: 3.6 mmol/L (ref 3.5–5.1)
Sodium: 136 mmol/L (ref 135–145)
Total Bilirubin: 0.8 mg/dL (ref 0.3–1.2)
Total Protein: 8.1 g/dL (ref 6.5–8.1)

## 2019-05-17 LAB — I-STAT CHEM 8, ED
BUN: 16 mg/dL (ref 6–20)
Calcium, Ion: 1.12 mmol/L — ABNORMAL LOW (ref 1.15–1.40)
Chloride: 106 mmol/L (ref 98–111)
Creatinine, Ser: 0.8 mg/dL (ref 0.44–1.00)
Glucose, Bld: 88 mg/dL (ref 70–99)
HCT: 45 % (ref 36.0–46.0)
Hemoglobin: 15.3 g/dL — ABNORMAL HIGH (ref 12.0–15.0)
Potassium: 3.4 mmol/L — ABNORMAL LOW (ref 3.5–5.1)
Sodium: 139 mmol/L (ref 135–145)
TCO2: 18 mmol/L — ABNORMAL LOW (ref 22–32)

## 2019-05-17 LAB — CBC
HCT: 42.6 % (ref 36.0–46.0)
Hemoglobin: 14.4 g/dL (ref 12.0–15.0)
MCH: 27.6 pg (ref 26.0–34.0)
MCHC: 33.8 g/dL (ref 30.0–36.0)
MCV: 81.8 fL (ref 80.0–100.0)
Platelets: 297 10*3/uL (ref 150–400)
RBC: 5.21 MIL/uL — ABNORMAL HIGH (ref 3.87–5.11)
RDW: 14.1 % (ref 11.5–15.5)
WBC: 12.5 10*3/uL — ABNORMAL HIGH (ref 4.0–10.5)
nRBC: 0 % (ref 0.0–0.2)

## 2019-05-17 LAB — I-STAT BETA HCG BLOOD, ED (MC, WL, AP ONLY): I-stat hCG, quantitative: <5 m[IU]/mL (ref <5)

## 2019-05-17 LAB — DIFFERENTIAL
Abs Immature Granulocytes: 0.04 10*3/uL (ref 0.00–0.07)
Basophils Absolute: 0.1 10*3/uL (ref 0.0–0.1)
Basophils Relative: 1 %
Eosinophils Absolute: 0.2 10*3/uL (ref 0.0–0.5)
Eosinophils Relative: 2 %
Immature Granulocytes: 0 %
Lymphocytes Relative: 38 %
Lymphs Abs: 4.7 10*3/uL — ABNORMAL HIGH (ref 0.7–4.0)
Monocytes Absolute: 0.7 10*3/uL (ref 0.1–1.0)
Monocytes Relative: 6 %
Neutro Abs: 6.7 10*3/uL (ref 1.7–7.7)
Neutrophils Relative %: 53 %

## 2019-05-17 LAB — PROTIME-INR
INR: 1.1 (ref 0.8–1.2)
Prothrombin Time: 13.7 seconds (ref 11.4–15.2)

## 2019-05-17 LAB — CBG MONITORING, ED: Glucose-Capillary: 93 mg/dL (ref 70–99)

## 2019-05-17 LAB — APTT: aPTT: 33 seconds (ref 24–36)

## 2019-05-17 MED ORDER — ACETAMINOPHEN 650 MG RE SUPP: 650.0000 mg | Freq: Four times a day (QID) | RECTAL | Status: DC | PRN

## 2019-05-17 MED ORDER — LORAZEPAM 2 MG/ML IJ SOLN
1.0000 mg | Freq: Once | INTRAMUSCULAR | Status: AC
  Administered 2019-05-17: 20:00:00 1 mg via INTRAVENOUS
  Filled 2019-05-17: qty 1

## 2019-05-17 MED ORDER — ENOXAPARIN SODIUM 40 MG/0.4ML ~~LOC~~ SOLN
40.0000 mg | SUBCUTANEOUS | Status: DC
  Administered 2019-05-18: 40 mg via SUBCUTANEOUS
  Filled 2019-05-17 (×2): qty 0.4

## 2019-05-17 MED ORDER — LABETALOL HCL 5 MG/ML IV SOLN
10.0000 mg | Freq: Once | INTRAVENOUS | Status: DC
  Filled 2019-05-17: qty 4

## 2019-05-17 MED ORDER — LABETALOL HCL 5 MG/ML IV SOLN: 10.0000 mg | INTRAVENOUS | Status: DC | PRN

## 2019-05-17 MED ORDER — ONDANSETRON HCL 4 MG PO TABS: 4.0000 mg | ORAL_TABLET | Freq: Four times a day (QID) | ORAL | Status: DC | PRN

## 2019-05-17 MED ORDER — LABETALOL HCL 5 MG/ML IV SOLN
10.0000 mg | Freq: Once | INTRAVENOUS | Status: AC
  Administered 2019-05-17: 10 mg via INTRAVENOUS
  Filled 2019-05-17: qty 4

## 2019-05-17 MED ORDER — HYDRALAZINE HCL 20 MG/ML IJ SOLN: 10.0000 mg | INTRAMUSCULAR | Status: DC | PRN

## 2019-05-17 MED ORDER — NAPROXEN 250 MG PO TABS
500.0000 mg | ORAL_TABLET | Freq: Every day | ORAL | Status: DC | PRN
  Filled 2019-05-17: qty 2

## 2019-05-17 MED ORDER — ACETAMINOPHEN 325 MG PO TABS: 650.0000 mg | ORAL_TABLET | Freq: Four times a day (QID) | ORAL | Status: DC | PRN

## 2019-05-17 MED ORDER — AMLODIPINE BESYLATE 5 MG PO TABS
5.0000 mg | ORAL_TABLET | Freq: Once | ORAL | Status: AC
  Administered 2019-05-17: 5 mg via ORAL
  Filled 2019-05-17: qty 1

## 2019-05-17 MED ORDER — SODIUM CHLORIDE 0.9% FLUSH
3.0000 mL | Freq: Once | INTRAVENOUS | Status: AC
  Administered 2019-05-17: 3 mL via INTRAVENOUS

## 2019-05-17 MED ORDER — ONDANSETRON HCL 4 MG/2ML IJ SOLN: 4.0000 mg | Freq: Four times a day (QID) | INTRAMUSCULAR | Status: DC | PRN

## 2019-05-17 MED ORDER — OLMESARTAN-AMLODIPINE-HCTZ 20-5-12.5 MG PO TABS: 1.0000 | ORAL_TABLET | Freq: Every day | ORAL | Status: DC

## 2019-05-17 NOTE — ED Notes (Signed)
Pt hx of previous  CVA and R side residual weakness.  Started feeling dizzy this AM at 11:00 and EMS was called with no transport.  At 1600-1630 pt began to have R side weakness without HA or other noted changes.  On arrival pt alert and oriented with exaggerated R side/arm weakness.  Pt slightly teary and anxious, questions if she will return to normal.  TO Ct at this time.

## 2019-05-17 NOTE — ED Notes (Signed)
Pt just returned from mri very drowsy drom the  Ativan given

## 2019-05-17 NOTE — ED Notes (Signed)
Pt attempting to  Call her family on her cell

## 2019-05-17 NOTE — Consult Note (Addendum)
Requesting Physician: Dr. Sherry Ruffing    Chief Complaint: right side weakness and dizziness  History obtained from: Patient and Chart     HPI:                                                                                                                                       Elika Melott is a 48 y.o. female with PMH of CVA with residual right side weakness with uncontrolled HTN presents as a code stroke.  LSN was 11 am patient at work and started having dizziness. EMS was called and patient refused to come to the hospital. Around 6 pm, EMS was called again for right side weakness. Patient was weaker on the right although ws able to walk to the ambulance with assistance. She was tearful as well. BP [id number] systolic.  Patient not been compliant with meds fort 3 months as she had run out of medications.   On arrival, she had CT scan of her head which was unremarkable, tPA not administered as she is outside of window. NIHSS was 3.   Date last known well: 05/17/19 Time last known well: 11 am tPA Given: no, outside tPA window  NIHSS: 3 Baseline MRS 1    PMH CVA HTN not on medications x3 months    No family history on file. Social History:  has no history on file for tobacco, alcohol, and drug.  Allergies: Not on File  Medications:                                                                                                                        I reviewed home medications   ROS:  14 systems reviewed and negative except above    Examination:                                                                                                      General: Appears well-developed Psych: Anxious and tearful  Eyes: No scleral injection HENT: No OP obstrucion Head: Normocephalic.  Cardiovascular: Normal rate and regular rhythm.  Respiratory: Effort normal and  breath sounds normal to anterior ascultation GI: Soft.  No distension. There is no tenderness.  Skin: WDI    Neurological Examination Mental Status: Alert, oriented, thought content appropriate.  Speech fluent without evidence of aphasia. Able to follow 3 step commands without difficulty. Cranial Nerves: II: Visual fields grossly normal,  III,IV, VI: ptosis not present, extra-ocular motions intact bilaterally, pupils equal, round, reactive to light and accommodation V,VII: smile symmetric, facial light touch sensation normal bilaterally VIII: hearing normal bilaterally IX,X: uvula rises symmetrically XI: bilateral shoulder shrug XII: midline tongue extension Motor: Right : Upper extremity   4/5    Left:     Upper extremity   5/5  Lower extremity   4/5     Lower extremity   5/5 Tone and bulk:normal tone throughout; no atrophy noted Sensory: reduced sensation to light touch on the right side  Plantars: Right: downgoing   Left: downgoing Cerebellar: no gross ataxia noted  Gait: not assessed      Lab Results: Basic Metabolic Panel: No results for input(s): NA, K, CL, CO2, GLUCOSE, BUN, CREATININE, CALCIUM, MG, PHOS in the last 168 hours.  CBC: No results for input(s): WBC, NEUTROABS, HGB, HCT, MCV, PLT in the last 168 hours.  Coagulation Studies: No results for input(s): LABPROT, INR in the last 72 hours.  Imaging: No results found.   ASSESSMENT AND PLAN  Mckaela Butter is a 48 y.o. female with PMH of CVA with residual right side weakness with uncontrolled HTN presents as a code stroke.  Suspect likely patient has hypertensive emergency vs acute stroke. Patient outside tPA window.   Hypertensive Emergency  MR Aaron Edelman to r/o stroke Depending on MRI Brain, If negative recommend control BP to less than 0000000 systolic  Addendum  MRI Brain negative for acute stroke.  Impression; HTN emergency    Gurnie Duris [facility] Pager Number [phone]

## 2019-05-17 NOTE — ED Notes (Signed)
Pt to MRI post IV Ativan and report given to C. Crisco RN.

## 2019-05-17 NOTE — H&P (Signed)
History and Physical    Victoria [name] [id number] DOB: 1971-07-16 DOA: 05/17/2019  PCP: Patient, No Pcp Per  Patient coming from: Home  I have personally briefly reviewed patient's old medical records in [place]  Chief Complaint: R sided weakness and dizziness  HPI: Victoria [name] is a 48 y.o. female with medical history significant of CVA and residual R sided weakness, uncontrolled HTN.  Patient developed dizziness starting at 11am this morning while at work.  EMS called and patient refused to come to hospital.  Around 6 pm EMS called again this time for R sided weakness.  Mild R sided weakness though able to walk to ambulance.   ED Course: BP > [id number] systolic in ED initially.  Patient unable to afford BP meds (on a 3 med combo pill of olmesartan, amlodipine, HCTZ) for past 3 months due to financials.  MRI brain neg for stroke.  Also: "Mild scattered opacity within the right mastoid air cells and middle ear cavity, of uncertain significance. Correlation with physical exam recommended." Only finding on physical exam was cerumen impaction which was removed by EDP.  Patient given 10mg  IV labetalol, unfortunatly not much effect on BP, still 188/100s.   Review of Systems: As per HPI, otherwise all review of systems negative.  Past Medical History:  Diagnosis Date  . Fibroid, uterine   . HTN (hypertension)   . MI (myocardial infarction) ([place])   . Stroke [facility])    Residual mild R sided weakness    Past Surgical History:  Procedure Laterality Date  . CHOLECYSTECTOMY    . ECTOPIC PREGNANCY SURGERY    . MYOMECTOMY    . TUBAL LIGATION       reports that she has quit smoking. She does not have any smokeless tobacco history on file. She reports that she does not drink alcohol or use drugs.  Allergies  Allergen Reactions  . Penicillins Shortness Of Breath    Did it involve swelling of the face/tongue/throat, SOB, or low BP? Yes Did it involve sudden or severe  rash/hives, skin peeling, or any reaction on the inside of your mouth or nose? unknown Did you need to seek medical attention at a hospital or doctor's office? Yes When did it last happen?childhood and 48 years old If all above answers are "NO", may proceed with cephalosporin use.  . Codeine Nausea And Vomiting    Family History  Problem Relation Age of Onset  . Stroke Mother   . Hypertension Mother      Prior to Admission medications   Medication Sig Start Date End Date Taking? Authorizing Provider  Multiple Vitamins-Minerals (ADULT ONE DAILY GUMMIES) CHEW Chew 2 tablets by mouth every morning.   Yes [provider]  naproxen sodium (ALEVE) 220 MG tablet Take 440 mg by mouth daily as needed (pain).   Yes [provider]  Olmesartan-amLODIPine-HCTZ (TRIBENZOR) 20-5-12.5 MG TABS Take 1 tablet by mouth daily.   Yes [provider]  Polyethyl Glycol-Propyl Glycol (SYSTANE OP) Place 1 drop into both eyes every hour while awake.   Yes [provider]    Physical Exam: Vitals:   05/17/19 2130 05/17/19 2145 05/17/19 2200 05/17/19 2230  BP: (!) 175/99 (!) 172/100 (!) 175/95 (!) 176/100  Pulse: 66 64 62 67  Resp: 19 13 17 16   Temp:      TempSrc:      SpO2: 100% 100% 99% 96%    Constitutional: NAD, calm, comfortable Eyes: PERRL, lids and conjunctivae normal ENMT:  Mucous membranes are moist. Posterior pharynx clear of any exudate or lesions.Normal dentition.  Neck: normal, supple, no masses, no thyromegaly Respiratory: clear to auscultation bilaterally, no wheezing, no crackles. Normal respiratory effort. No accessory muscle use.  Cardiovascular: Regular rate and rhythm, no murmurs / rubs / gallops. No extremity edema. 2+ pedal pulses. No carotid bruits.  Abdomen: no tenderness, no masses palpated. No hepatosplenomegaly. Bowel sounds positive.  Musculoskeletal: no clubbing / cyanosis. No joint deformity upper and lower extremities. Good ROM, no  contractures. Normal muscle tone.  Skin: no rashes, lesions, ulcers. No induration Neurologic: RUE, RLE 4/5, 5/5 on Left. Psychiatric: Normal judgment and insight. Alert and oriented x 3. Normal mood.    Labs on Admission: I have personally reviewed following labs and imaging studies  CBC: Recent Labs  Lab 05/17/19 1853 05/17/19 1859  WBC 12.5*  --   NEUTROABS 6.7  --   HGB 14.4 15.3*  HCT 42.6 45.0  MCV 81.8  --   PLT 297  --    Basic Metabolic Panel: Recent Labs  Lab 05/17/19 1853 05/17/19 1859  NA 136 139  K 3.6 3.4*  CL 107 106  CO2 19*  --   GLUCOSE 91 88  BUN 15 16  CREATININE 1.03* 0.80  CALCIUM 9.1  --    GFR: CrCl cannot be calculated (Unknown ideal weight.). Liver Function Tests: Recent Labs  Lab 05/17/19 1853  AST 19  ALT 14  ALKPHOS 69  BILITOT 0.8  PROT 8.1  ALBUMIN 4.1   No results for input(s): LIPASE, AMYLASE in the last 168 hours. No results for input(s): AMMONIA in the last 168 hours. Coagulation Profile: Recent Labs  Lab 05/17/19 1853  INR 1.1   Cardiac Enzymes: No results for input(s): CKTOTAL, CKMB, CKMBINDEX, TROPONINI in the last 168 hours. BNP (last 3 results) No results for input(s): PROBNP in the last 8760 hours. HbA1C: No results for input(s): HGBA1C in the last 72 hours. CBG: Recent Labs  Lab 05/17/19 1852  GLUCAP 93   Lipid Profile: No results for input(s): CHOL, HDL, LDLCALC, TRIG, CHOLHDL, LDLDIRECT in the last 72 hours. Thyroid Function Tests: No results for input(s): TSH, T4TOTAL, FREET4, T3FREE, THYROIDAB in the last 72 hours. Anemia Panel: No results for input(s): VITAMINB12, FOLATE, FERRITIN, TIBC, IRON, RETICCTPCT in the last 72 hours. Urine analysis: No results found for: COLORURINE, APPEARANCEUR, LABSPEC, Ida, GLUCOSEU, HGBUR, BILIRUBINUR, KETONESUR, PROTEINUR, UROBILINOGEN, NITRITE, LEUKOCYTESUR  Radiological Exams on Admission: Mr Brain Wo Contrast  Result Date: 05/17/2019 CLINICAL DATA:   Initial evaluation for acute dizziness, right-sided weakness. EXAM: MRI HEAD WITHOUT CONTRAST TECHNIQUE: Multiplanar, multiecho pulse sequences of the brain and surrounding structures were obtained without intravenous contrast. COMPARISON:  Prior head CT from earlier same day. FINDINGS: Brain: Cerebral volume within normal limits for patient age. No focal parenchymal signal abnormality identified. No abnormal foci of restricted diffusion to suggest acute or subacute ischemia. Gray-white matter differentiation well maintained. No encephalomalacia to suggest chronic infarction. No foci of susceptibility artifact to suggest acute or chronic intracranial hemorrhage. No mass lesion, midline shift or mass effect. No hydrocephalus. No extra-axial fluid collection. Major dural sinuses are grossly patent. Pituitary gland and suprasellar region are normal. Midline structures intact and normal. Vascular: Major intracranial vascular flow voids well maintained and normal in appearance. Skull and upper cervical spine: Craniocervical junction normal. Visualized upper cervical spine within normal limits. Bone marrow signal intensity normal. No scalp soft tissue abnormality. Sinuses/Orbits: Globes and orbital soft tissues within normal  limits. Paranasal sinuses are clear. Mild scattered opacity within the right mastoid air cells and middle ear cavity, of uncertain significance. Negative nasopharynx. Inner ear structures grossly normal. Other: None. IMPRESSION: 1. Normal brain MRI. No acute intracranial abnormality identified. 2. Mild scattered opacity within the right mastoid air cells and middle ear cavity, of uncertain significance. Correlation with physical exam recommended. Electronically Signed   By: Jeannine Boga M.D.   On: 05/17/2019 20:38   Ct Head Code Stroke Wo Contrast  Result Date: 05/17/2019 CLINICAL DATA:  Code stroke. Dizziness with RIGHT-sided weakness. Last seen normal 1100 hours earlier today. EXAM: CT  HEAD WITHOUT CONTRAST TECHNIQUE: Contiguous axial images were obtained from the base of the skull through the vertex without intravenous contrast. COMPARISON:  None. FINDINGS: Brain: No evidence of acute infarction, hemorrhage, hydrocephalus, extra-axial collection or mass lesion/mass effect. Normal for age cerebral volume. No white matter disease. Vascular: No hyperdense vessel or unexpected calcification. Skull: Normal. Negative for fracture or focal lesion. Incidental exuberant dural calcification, notable over the tentorium. Sinuses/Orbits: No acute finding. Other: None. ASPECTS [facility] Stroke Program Early CT Score) - Ganglionic level infarction (caudate, lentiform nuclei, internal capsule, insula, M1-M3 cortex): 7 - Supraganglionic infarction (M4-M6 cortex): 3 Total score (0-10 with 10 being normal): 10 IMPRESSION: Negative exam. *ASPECTS is 10. * These results were communicated to Dr. Leonel Ramsay at 7:08 pmon 9/20/2020by text page via the [facility] messaging system. Electronically Signed   By: Staci Righter M.D.   On: 05/17/2019 19:10    EKG: Independently reviewed.  Assessment/Plan Active Problems:   Hypertensive urgency    1. HTN urgency - goal BP < 160/100 1. Labetalol PRN 2. Hydralazine PRN if labetalol not working 3. Tele monitor 4. Amlodipine 5mg  PO now 5. Resume home BP meds in AM  DVT prophylaxis: Lovenox Code Status: Full Family Communication: Family at bedside Disposition Plan: Home after admit Consults called: Neuro has seen patient Admission status: Place in 15    [place], [place] Hospitalists  How to contact the [facility] Attending or Consulting provider [place] or covering provider during after hours [place], for this patient?  1. Check the care team in [facility] and look for a) attending/consulting [facility] provider listed and b) the [facility] team listed 2. Log into [email or web address]  Amion Physician Scheduling and messaging for groups and whole hospitals  On call and physician  scheduling software for group practices, residents, hospitalists and other medical providers for call, clinic, rotation and shift schedules. OnCall [employer] is a hospital-wide system for scheduling doctors and paging doctors on call. EasyPlot is for scientific plotting and data analysis.  [email or web address]  and use [place]'s universal password to access. If you do not have the password, please contact the hospital operator.  3. Locate the [facility] provider you are looking for under [facility] and page to a number that you can be directly reached. 4. If you still have difficulty reaching the provider, please page the [facility] (Director on Call) for the Hospitalists listed on amion for assistance.  05/17/2019, 10:38 PM

## 2019-05-17 NOTE — ED Notes (Signed)
To room 35 with MRI ordered at this time.  Pt remains alert and anxious.  Waiting for Ativan to clear through pharmacy.

## 2019-05-17 NOTE — ED Provider Notes (Addendum)
[place] EMERGENCY DEPARTMENT Provider Note   CSN: [id number] Arrival date & time: 05/17/19  1850     History   Chief Complaint No chief complaint on file. Code Stroke   HPI Victoria [name] is a 48 y.o. female.     The history is provided by the patient and medical records.  Neurologic Problem This is a new problem. The current episode started 6 to 12 hours ago. The problem occurs constantly. The problem has not changed since onset.Associated symptoms include headaches. Pertinent negatives include no chest pain, no abdominal pain and no shortness of breath. Nothing aggravates the symptoms. Nothing relieves the symptoms. She has tried nothing for the symptoms.    No past medical history on file.  There are no active problems to display for this patient.   Past Surgical History:  Procedure Laterality Date   CHOLECYSTECTOMY     ECTOPIC PREGNANCY SURGERY     MYOMECTOMY     TUBAL LIGATION       OB History   No obstetric history on file.      Home Medications    Prior to Admission medications   Not on File    Family History No family history on file.  Social History Social History   Tobacco Use   Smoking status: Not on file  Substance Use Topics   Alcohol use: Not on file   Drug use: Not on file     Allergies   Patient has no allergy information on record.   Review of Systems Review of Systems  Constitutional: Negative for chills, diaphoresis, fatigue and fever.  HENT: Negative for congestion.   Eyes: Negative for photophobia and visual disturbance.  Respiratory: Negative for cough, chest tightness and shortness of breath.   Cardiovascular: Negative for chest pain.  Gastrointestinal: Negative for abdominal pain, constipation, nausea and vomiting.  Genitourinary: Negative for flank pain.  Musculoskeletal: Negative for back pain, neck pain and neck stiffness.  Neurological: Positive for dizziness, weakness,  light-headedness, numbness and headaches. Negative for seizures, facial asymmetry and speech difficulty.  Psychiatric/Behavioral: Negative for agitation and confusion.  All other systems reviewed and are negative.    Physical Exam Updated Vital Signs There were no vitals taken for this visit.  Physical Exam Vitals signs and nursing note reviewed.  Constitutional:      General: She is not in acute distress.    Appearance: She is well-developed. She is obese. She is not ill-appearing, toxic-appearing or diaphoretic.  HENT:     Head: Normocephalic and atraumatic.     Right Ear: External ear normal.     Left Ear: External ear normal.     Nose: Nose normal.     Mouth/Throat:     Pharynx: No oropharyngeal exudate.  Eyes:     Conjunctiva/sclera: Conjunctivae normal.     Pupils: Pupils are equal, round, and reactive to light.  Neck:     Musculoskeletal: Normal range of motion and neck supple. No muscular tenderness.  Cardiovascular:     Rate and Rhythm: Normal rate.     Pulses: Normal pulses.     Heart sounds: No murmur.  Pulmonary:     Effort: Pulmonary effort is normal. No respiratory distress.     Breath sounds: No stridor. No wheezing, rhonchi or rales.  Chest:     Chest wall: No tenderness.  Abdominal:     General: There is no distension.     Tenderness: There is no abdominal tenderness. There is  no rebound.  Musculoskeletal:        General: No tenderness.     Right lower leg: No edema.     Left lower leg: No edema.  Skin:    General: Skin is warm.     Findings: No erythema or rash.  Neurological:     Mental Status: She is alert and oriented to person, place, and time.     GCS: GCS eye subscore is 4. GCS verbal subscore is 5. GCS motor subscore is 6.     Cranial Nerves: No cranial nerve deficit.     Sensory: Sensory deficit present.     Motor: Weakness present. No abnormal muscle tone.     Deep Tendon Reflexes: Reflexes are normal and symmetric.     Comments:  Subjective numbness and right face, right arm, and right leg.  Weakness in right arm and right leg compared to left.  Exam otherwise unremarkable.  Psychiatric:        Mood and Affect: Mood normal.      ED Treatments / Results  Labs (all labs ordered are listed, but only abnormal results are displayed) Labs Reviewed  CBC - Abnormal; Notable for the following components:      Result Value   WBC 12.5 (*)    RBC 5.21 (*)    All other components within normal limits  DIFFERENTIAL - Abnormal; Notable for the following components:   Lymphs Abs 4.7 (*)    All other components within normal limits  COMPREHENSIVE METABOLIC PANEL - Abnormal; Notable for the following components:   CO2 19 (*)    Creatinine, Ser 1.03 (*)    All other components within normal limits  I-STAT CHEM 8, ED - Abnormal; Notable for the following components:   Potassium 3.4 (*)    Calcium, Ion 1.12 (*)    TCO2 18 (*)    Hemoglobin 15.3 (*)    All other components within normal limits  SARS CORONAVIRUS 2 (TAT 6-24 HRS)  PROTIME-INR  APTT  HIV ANTIBODY (ROUTINE TESTING W REFLEX)  CBG MONITORING, ED  I-STAT BETA HCG BLOOD, ED ([facility], [facility], [facility] ONLY)    EKG EKG Interpretation  Date/Time:  Sunday May 17 2019 19:15:40 EDT Ventricular Rate:  67 PR Interval:    QRS Duration: 97 QT Interval:  433 QTC Calculation: 458 R Axis:   71 Text Interpretation:  Sinus rhythm Borderline short PR interval No prior ECG for comparison.  No STEMI Confirmed by Antony Blackbird [id number]) on 05/17/2019 7:57:31 PM   Radiology Mr Brain Wo Contrast  Result Date: 05/17/2019 CLINICAL DATA:  Initial evaluation for acute dizziness, right-sided weakness. EXAM: MRI HEAD WITHOUT CONTRAST TECHNIQUE: Multiplanar, multiecho pulse sequences of the brain and surrounding structures were obtained without intravenous contrast. COMPARISON:  Prior head CT from earlier same day. FINDINGS: Brain: Cerebral volume within normal limits for patient age. No  focal parenchymal signal abnormality identified. No abnormal foci of restricted diffusion to suggest acute or subacute ischemia. Gray-white matter differentiation well maintained. No encephalomalacia to suggest chronic infarction. No foci of susceptibility artifact to suggest acute or chronic intracranial hemorrhage. No mass lesion, midline shift or mass effect. No hydrocephalus. No extra-axial fluid collection. Major dural sinuses are grossly patent. Pituitary gland and suprasellar region are normal. Midline structures intact and normal. Vascular: Major intracranial vascular flow voids well maintained and normal in appearance. Skull and upper cervical spine: Craniocervical junction normal. Visualized upper cervical spine within normal limits. Bone marrow signal intensity normal. No  scalp soft tissue abnormality. Sinuses/Orbits: Globes and orbital soft tissues within normal limits. Paranasal sinuses are clear. Mild scattered opacity within the right mastoid air cells and middle ear cavity, of uncertain significance. Negative nasopharynx. Inner ear structures grossly normal. Other: None. IMPRESSION: 1. Normal brain MRI. No acute intracranial abnormality identified. 2. Mild scattered opacity within the right mastoid air cells and middle ear cavity, of uncertain significance. Correlation with physical exam recommended. Electronically Signed   By: Jeannine Boga M.D.   On: 05/17/2019 20:38   Ct Head Code Stroke Wo Contrast  Result Date: 05/17/2019 CLINICAL DATA:  Code stroke. Dizziness with RIGHT-sided weakness. Last seen normal 1100 hours earlier today. EXAM: CT HEAD WITHOUT CONTRAST TECHNIQUE: Contiguous axial images were obtained from the base of the skull through the vertex without intravenous contrast. COMPARISON:  None. FINDINGS: Brain: No evidence of acute infarction, hemorrhage, hydrocephalus, extra-axial collection or mass lesion/mass effect. Normal for age cerebral volume. No white matter disease.  Vascular: No hyperdense vessel or unexpected calcification. Skull: Normal. Negative for fracture or focal lesion. Incidental exuberant dural calcification, notable over the tentorium. Sinuses/Orbits: No acute finding. Other: None. ASPECTS [facility] Stroke Program Early CT Score) - Ganglionic level infarction (caudate, lentiform nuclei, internal capsule, insula, M1-M3 cortex): 7 - Supraganglionic infarction (M4-M6 cortex): 3 Total score (0-10 with 10 being normal): 10 IMPRESSION: Negative exam. *ASPECTS is 10. * These results were communicated to Dr. Leonel Ramsay at 7:08 pmon 9/20/2020by text page via the [facility] messaging system. Electronically Signed   By: Staci Righter M.D.   On: 05/17/2019 19:10    Procedures Procedures (including critical care time)  CRITICAL CARE Performed by: Gwenyth Allegra [place] Lebeau Total critical care time: 40 minutes Critical care time was exclusive of separately billable procedures and treating other patients. Critical care was necessary to treat or prevent imminent or life-threatening deterioration. Critical care was time spent personally by me on the following activities: development of treatment plan with patient and/or surrogate as well as nursing, discussions with consultants, evaluation of patient's response to treatment, examination of patient, obtaining history from patient or surrogate, ordering and performing treatments and interventions, ordering and review of laboratory studies, ordering and review of radiographic studies, pulse oximetry and re-evaluation of patient's condition.   Medications Ordered in ED Medications  labetalol (NORMODYNE) injection 10 mg (10 mg Intravenous Not Given 05/17/19 2245)  labetalol (NORMODYNE) injection 10-20 mg (has no administration in time range)  Olmesartan-amLODIPine-HCTZ 20-5-12.5 MG TABS 1 tablet (has no administration in time range)  naproxen (NAPROSYN) tablet 500 mg (has no administration in time range)  acetaminophen (TYLENOL)  tablet 650 mg (has no administration in time range)    Or  acetaminophen (TYLENOL) suppository 650 mg (has no administration in time range)  ondansetron (ZOFRAN) tablet 4 mg (has no administration in time range)    Or  ondansetron (ZOFRAN) injection 4 mg (has no administration in time range)  enoxaparin (LOVENOX) injection 40 mg (has no administration in time range)  hydrALAZINE (APRESOLINE) injection 10-20 mg (has no administration in time range)  sodium chloride flush (NS) 0.9 % injection 3 mL (3 mLs Intravenous Given 05/17/19 1934)  LORazepam (ATIVAN) injection 1 mg (1 mg Intravenous Given 05/17/19 1933)  labetalol (NORMODYNE) injection 10 mg (10 mg Intravenous Given 05/17/19 2110)  amLODipine (NORVASC) tablet 5 mg (5 mg Oral Given 05/17/19 2312)     Initial Impression / Assessment and Plan / ED Course  I have reviewed the triage vital signs and the nursing notes.  Pertinent labs & imaging results that were available during my care of the patient were reviewed by me and considered in my medical decision making (see chart for details).        Danyah Bireley is a 48 y.o. female with no significant past medical history presents as a code stroke for right-sided numbness and weakness.  According to EMS, patient started feeling better at 11 AM this morning with some mild headache and some lightheadedness/dizziness.  Patient reports that then progressed to right-sided weakness and numbness prompting her to come to the emergency department.  Patient quickly made a code stroke by EMS and was taken to [facility].  I saw the patient after initial CT scans.  On my exam, patient does have numbness and weakness to right arm and right leg.  She also reports numbness in her right face.  No facial droop.  Normal speech.  Lungs clear chest and abdomen nontender.  Good pulses in extremities.  Neurology ordered MRI to further look for stroke.  Patient blood pressure was elevated at 200/109 initially.  No  history of hypertension.  Neurology thinks this may be more related to hypertensive emergency than stroke however if MRI is negative, they still think patient would likely need to be admitted for hypertensive emergency management.  Anticipate reassessment after she returns from MRI.     MRI showed no acute stroke.  Possible abnormality with the ear however after ear wax was removed, no abnormality seen.  Neurology felt this is likely hypertensive emergency.  Patient given several doses of labetalol with continued hypertension.  Patient will be admitted for further management.   Final Clinical Impressions(s) / ED Diagnoses   Final diagnoses:  Hypertensive emergency  Right sided weakness  Right sided numbness    ED Discharge Orders    None      Clinical Impression: 1. Hypertensive emergency   2. Right sided weakness   3. Right sided numbness     Disposition: Admit  This note was prepared with assistance of Dragon voice recognition software. Occasional wrong-word or sound-a-like substitutions may have occurred due to the inherent limitations of voice recognition software.     Kenecia Barren, Gwenyth Allegra, MD 05/18/19 0018    Merdith Boyd, Gwenyth Allegra, MD 05/28/19 1432

## 2019-05-17 NOTE — ED Notes (Signed)
Pt passed the swallow screen 

## 2019-05-18 ENCOUNTER — Encounter (HOSPITAL_COMMUNITY): Payer: Self-pay

## 2019-05-18 ENCOUNTER — Other Ambulatory Visit: Payer: Self-pay

## 2019-05-18 LAB — HIV ANTIBODY (ROUTINE TESTING W REFLEX): HIV Screen 4th Generation wRfx: NONREACTIVE

## 2019-05-18 LAB — SARS CORONAVIRUS 2 (TAT 6-24 HRS): SARS Coronavirus 2: NEGATIVE

## 2019-05-18 MED ORDER — HYDROCHLOROTHIAZIDE 12.5 MG PO CAPS
12.5000 mg | ORAL_CAPSULE | Freq: Every day | ORAL | Status: DC
  Administered 2019-05-18: 12.5 mg via ORAL
  Filled 2019-05-18: qty 1

## 2019-05-18 MED ORDER — IRBESARTAN 150 MG PO TABS
150.0000 mg | ORAL_TABLET | Freq: Every day | ORAL | Status: DC
  Administered 2019-05-18: 150 mg via ORAL
  Filled 2019-05-18: qty 1

## 2019-05-18 MED ORDER — AMLODIPINE BESYLATE 5 MG PO TABS
5.0000 mg | ORAL_TABLET | Freq: Every day | ORAL | Status: DC
  Administered 2019-05-18: 5 mg via ORAL
  Filled 2019-05-18: qty 1

## 2019-05-18 MED ORDER — LOSARTAN POTASSIUM 50 MG PO TABS: 50.0000 mg | ORAL_TABLET | Freq: Every day | ORAL | 0 refills | Status: DC

## 2019-05-18 MED ORDER — AMLODIPINE BESYLATE 5 MG PO TABS: 5.0000 mg | ORAL_TABLET | Freq: Every day | ORAL | 0 refills | Status: DC

## 2019-05-18 MED ORDER — ASPIRIN EC 81 MG PO TBEC: 81.0000 mg | DELAYED_RELEASE_TABLET | Freq: Every day | ORAL | 0 refills | Status: AC

## 2019-05-18 MED ORDER — HYDROCHLOROTHIAZIDE 12.5 MG PO CAPS: 12.5000 mg | ORAL_CAPSULE | Freq: Every day | ORAL | 0 refills | Status: DC

## 2019-05-18 MED FILL — HYDROCHLOROTHIAZIDE 12.5 MG: 12.5 | 30 days supply | Qty: 30 | Fill #0

## 2019-05-18 MED FILL — AMLODIPINE BESYLATE 5 MG TA: 5 | 30 days supply | Qty: 30 | Fill #0

## 2019-05-18 MED FILL — LOSARTAN POTASSIUM 50 MG TA: 50 | 30 days supply | Qty: 30 | Fill #0

## 2019-05-18 MED FILL — ASPIRIN 81MG ADULT LOW STRE: 81 | 30 days supply | Qty: 30 | Fill #0

## 2019-05-18 NOTE — TOC Initial Note (Signed)
Transition of Care [facility]) - Initial/Assessment Note    Patient Details  Name: Victoria [name] MRN: [id number] Date of Birth: 01-22-1971  Transition [facility]) CM/SW Contact:    Marilu Favre, RN Phone Number: 05/18/2019, 1:29 PM  Clinical Narrative:                  Patient from home alone. Has transportation home.   Does not have PCP , provided [facility] information .   Prescriptions will be sent to transitions of care pharmacy and will enter patient in [facility]. Patient aware and in agreement. Patient voiced understanding that [place] is only once a year.  Expected Discharge Plan: Home/Self Care Barriers to Discharge: No Barriers Identified   Patient Goals and CMS Choice Patient states their goals for this hospitalization and ongoing recovery are:: to go home CMS Medicare.gov Compare Post Acute Care list provided to:: Patient Choice offered to / list presented to : NA  Expected Discharge Plan and Services Expected Discharge Plan: Home/Self Care In-house Referral: Financial Counselor Discharge Planning Services: [facility], [facility], Medication Assistance Post Acute Care Choice: NA Living arrangements for the past 2 months: Single Family Home Expected Discharge Date: 05/18/19               DME Arranged: N/A         HH Arranged: NA          Prior Living Arrangements/Services Living arrangements for the past 2 months: Single Family Home Lives with:: Self Patient language and need for interpreter reviewed:: Yes        Need for Family Participation in Patient Care: No (Comment) Care giver support system in place?: Yes (comment)   Criminal Activity/Legal Involvement Pertinent to Current Situation/Hospitalization: No - Comment as needed  Activities of Daily Living Home Assistive Devices/Equipment: Blood pressure cuff ADL Screening (condition at time of admission) Patient's cognitive ability adequate to safely complete daily  activities?: Yes Is the patient deaf or have difficulty hearing?: No Does the patient have difficulty seeing, even when wearing glasses/contacts?: No Does the patient have difficulty concentrating, remembering, or making decisions?: No Patient able to express need for assistance with ADLs?: Yes Does the patient have difficulty dressing or bathing?: No Independently performs ADLs?: Yes (appropriate for developmental age) Does the patient have difficulty walking or climbing stairs?: No Weakness of Legs: Right Weakness of Arms/Hands: Right  Permission Sought/Granted   Permission granted to share information with : Yes, Verbal Permission Granted     Permission granted to share info w AGENCY: [facility] pharmacy        Emotional Assessment   Attitude/Demeanor/Rapport: Engaged Affect (typically observed): Accepting Orientation: : Oriented to  Time, Oriented to Situation, Oriented to Place, Oriented to Self Alcohol / Substance Use: Not Applicable Psych Involvement: No (comment)  Admission diagnosis:  Hypertensive emergency [I16.1] Right sided weakness [R53.1] Right sided numbness [R20.0] Patient Active Problem List   Diagnosis Date Noted  . Hypertensive urgency 05/17/2019   PCP:  Patient, No Pcp Per Pharmacy:   [facility] [address] ([address]), [place] - [place] [id number] [place] ([place]) [place] [address] Phone: [phone] Fax: [phone]  [place], [place] [address] [place] [address] Phone: [phone] Fax: [phone]     Social Determinants of Health (SDOH) Interventions    Readmission Risk Interventions No flowsheet data found.

## 2019-05-18 NOTE — ED Notes (Signed)
Breakfast Ordered 

## 2019-05-18 NOTE — ED Notes (Signed)
The pt  Appears to have less problem moving her rt arm when her friend is not in the room.

## 2019-05-18 NOTE — ED Notes (Signed)
Tele

## 2019-05-18 NOTE — Discharge Summary (Addendum)
Discharge Summary  Victoria [name] [id number] DOB: [birth year]  PCP: Patient, No Pcp Per  Admit date: 05/17/2019 Discharge date: 05/18/2019  Time spent: 30 mins  Recommendations for Outpatient Follow-up:  1. Follow-up with [facility] to establish care with PCP in 1 week  Discharge Diagnoses:  Active Hospital Problems   Diagnosis Date Noted  . Hypertensive urgency 05/17/2019    Resolved Hospital Problems  No resolved problems to display.    Discharge Condition: Stable  Diet recommendation: Heart healthy  Vitals:   05/18/19 0801 05/18/19 1400  BP: (!) 143/84 (!) 163/94  Pulse: 62 64  Resp: 16 16  Temp: 98.1 F (36.7 C) 98.1 F (36.7 C)  SpO2: 98% 98%    History of present illness:  Victoria [name] is a 48 y.o. female with medical history significant of CVA and residual R sided weakness, uncontrolled HTN. Patient developed dizziness with R sided weakness and was brought by EMS to the ED.  In the ED, BP > [id number] systolic in ED. Patient unable to afford BP meds (on a 3 med combo pill of olmesartan, amlodipine, HCTZ) for past 3 months due to financial issue and recently lost her insurance. MRI brain neg for stroke.  Also: "Mild scattered opacity within the right mastoid air cells and middle ear cavity, of uncertain significance. Correlation with physical exam recommended." Only finding on physical exam was cerumen impaction which was removed by EDP.  Patient was admitted for BP management.    Today, patient reported feeling better, denies any chest pain, shortness of breath, abdominal pain, fever/chills, dysuria, cough, nausea/vomiting, diarrhea, dizziness. Patient advised to be compliant with her medications, and diet controlled.  Patient set up with [facility] health wellness, for follow-up.  Hospital Course:  Principal Problem:   Hypertensive urgency  Hypertensive crisis BP much improved this a.m. EKG with sinus rhythm CT head unremarkable MRI are  unremarkable Patient was on Tribenzor, will switch over to amlodipine 5 mg, losartan 50 mg and HCTZ 12.5 mg Patient advised to be compliant with medications, case manager on board to help her with cost of medication Patient advised to set up appointments with [facility] for close follow-up  History of CVA Residual right-sided weakness back to baseline Work-up above negative Neurology consulted ??? Not on aspirin at home, start aspirin  Not on Lipitor, PCP to order lipid panel Follow-up with PCP for further outpatient work-up  Leukocytosis Likely reactive, no signs of infection Follow-up with PCP           Malnutrition Type:      Malnutrition Characteristics:      Nutrition Interventions:      Estimated body mass index is 31.75 kg/m as calculated from the following:   Height as of this encounter: 5\' 9"  (1.753 m).   Weight as of this encounter: 97.5 kg.    Procedures: None  Consultations:  Neurology  Discharge Exam: BP (!) 163/94 (BP Location: Right Arm)   Pulse 64   Temp 98.1 F (36.7 C) (Oral)   Resp 16   Ht 5\' 9"  (1.753 m)   Wt 97.5 kg   LMP 12/15/2017 (Within Weeks)   SpO2 98%   BMI 31.75 kg/m   General: NAD Cardiovascular: S1, S2 present Respiratory: CTA B Neurology: 4/5 in both RUE/RLE and 5/5 in the lower extremity.,  No new focal neurologic deficit noted  Discharge Instructions You were cared for by a hospitalist during your hospital stay. If you have any questions about your  discharge medications or the care you received while you were in the hospital after you are discharged, you can call the unit and asked to speak with the hospitalist on call if the hospitalist that took care of you is not available. Once you are discharged, your primary care physician will handle any further medical issues. Please note that NO REFILLS for any discharge medications will be authorized once you are discharged, as it is imperative that you return  to your primary care physician (or establish a relationship with a primary care physician if you do not have one) for your aftercare needs so that they can reassess your need for medications and monitor your lab values.   Allergies as of 05/18/2019      Reactions   Penicillins Shortness Of Breath   Did it involve swelling of the face/tongue/throat, SOB, or low BP? Yes Did it involve sudden or severe rash/hives, skin peeling, or any reaction on the inside of your mouth or nose? unknown Did you need to seek medical attention at a hospital or doctor's office? Yes When did it last happen?childhood and 48 years old If all above answers are "NO", may proceed with cephalosporin use.   Codeine Nausea And Vomiting      Medication List    STOP taking these medications   Tribenzor 20-5-12.5 MG Tabs Generic drug: Olmesartan-amLODIPine-HCTZ     TAKE these medications   Adult One Daily Gummies Chew Chew 2 tablets by mouth every morning.   amLODipine 5 MG tablet Commonly known as: NORVASC Take 1 tablet (5 mg total) by mouth daily. Start taking on: May 19, 2019   aspirin EC 81 MG tablet Take 1 tablet (81 mg total) by mouth daily.   hydrochlorothiazide 12.5 MG capsule Commonly known as: Microzide Take 1 capsule (12.5 mg total) by mouth daily.   losartan 50 MG tablet Commonly known as: Cozaar Take 1 tablet (50 mg total) by mouth daily.   naproxen sodium 220 MG tablet Commonly known as: ALEVE Take 440 mg by mouth daily as needed (pain).   SYSTANE OP Place 1 drop into both eyes every hour while awake.      Allergies  Allergen Reactions  . Penicillins Shortness Of Breath    Did it involve swelling of the face/tongue/throat, SOB, or low BP? Yes Did it involve sudden or severe rash/hives, skin peeling, or any reaction on the inside of your mouth or nose? unknown Did you need to seek medical attention at a hospital or doctor's office? Yes When did it last happen?childhood  and 48 years old If all above answers are "NO", may proceed with cephalosporin use.  . Codeine Nausea And Vomiting   Follow-up Information    [facility]. Schedule an appointment as soon as possible for a visit.   Why: This will be your primary care, please call them for a follow up appointment within 1 week Contact information: [address] [place] [phone]           The results of significant diagnostics from this hospitalization (including imaging, microbiology, ancillary and laboratory) are listed below for reference.    Significant Diagnostic Studies: Mr Brain 50 Contrast  Result Date: 05/17/2019 CLINICAL DATA:  Initial evaluation for acute dizziness, right-sided weakness. EXAM: MRI HEAD WITHOUT CONTRAST TECHNIQUE: Multiplanar, multiecho pulse sequences of the brain and surrounding structures were obtained without intravenous contrast. COMPARISON:  Prior head CT from earlier same day. FINDINGS: Brain: Cerebral  volume within normal limits for patient age. No focal parenchymal signal abnormality identified. No abnormal foci of restricted diffusion to suggest acute or subacute ischemia. Gray-white matter differentiation well maintained. No encephalomalacia to suggest chronic infarction. No foci of susceptibility artifact to suggest acute or chronic intracranial hemorrhage. No mass lesion, midline shift or mass effect. No hydrocephalus. No extra-axial fluid collection. Major dural sinuses are grossly patent. Pituitary gland and suprasellar region are normal. Midline structures intact and normal. Vascular: Major intracranial vascular flow voids well maintained and normal in appearance. Skull and upper cervical spine: Craniocervical junction normal. Visualized upper cervical spine within normal limits. Bone marrow signal intensity normal. No scalp soft tissue abnormality. Sinuses/Orbits: Globes and orbital soft tissues  within normal limits. Paranasal sinuses are clear. Mild scattered opacity within the right mastoid air cells and middle ear cavity, of uncertain significance. Negative nasopharynx. Inner ear structures grossly normal. Other: None. IMPRESSION: 1. Normal brain MRI. No acute intracranial abnormality identified. 2. Mild scattered opacity within the right mastoid air cells and middle ear cavity, of uncertain significance. Correlation with physical exam recommended. Electronically Signed   By: Jeannine Boga M.D.   On: 05/17/2019 20:38   Ct Head Code Stroke Wo Contrast  Result Date: 05/17/2019 CLINICAL DATA:  Code stroke. Dizziness with RIGHT-sided weakness. Last seen normal 1100 hours earlier today. EXAM: CT HEAD WITHOUT CONTRAST TECHNIQUE: Contiguous axial images were obtained from the base of the skull through the vertex without intravenous contrast. COMPARISON:  None. FINDINGS: Brain: No evidence of acute infarction, hemorrhage, hydrocephalus, extra-axial collection or mass lesion/mass effect. Normal for age cerebral volume. No white matter disease. Vascular: No hyperdense vessel or unexpected calcification. Skull: Normal. Negative for fracture or focal lesion. Incidental exuberant dural calcification, notable over the tentorium. Sinuses/Orbits: No acute finding. Other: None. ASPECTS [facility] Stroke Program Early CT Score) - Ganglionic level infarction (caudate, lentiform nuclei, internal capsule, insula, M1-M3 cortex): 7 - Supraganglionic infarction (M4-M6 cortex): 3 Total score (0-10 with 10 being normal): 10 IMPRESSION: Negative exam. *ASPECTS is 10. * These results were communicated to Dr. Leonel Ramsay at 7:08 pmon 9/20/2020by text page via the [facility] messaging system. Electronically Signed   By: Staci Righter M.D.   On: 05/17/2019 19:10    Microbiology: Recent Results (from the past 240 hour(s))  SARS CORONAVIRUS 2 (TAT 6-24 HRS) Nasopharyngeal Nasopharyngeal Swab     Status: None   Collection Time:  05/17/19 10:18 PM   Specimen: Nasopharyngeal Swab  Result Value Ref Range Status   SARS Coronavirus 2 NEGATIVE NEGATIVE Final    Comment: (NOTE) SARS-CoV-2 target nucleic acids are NOT DETECTED. The SARS-CoV-2 RNA is generally detectable in upper and lower respiratory specimens during the acute phase of infection. Negative results do not preclude SARS-CoV-2 infection, do not rule out co-infections with other pathogens, and should not be used as the sole basis for treatment or other patient management decisions. Negative results must be combined with clinical observations, patient history, and epidemiological information. The expected result is Negative. Fact Sheet for Patients: SugarRoll.be Fact Sheet for Healthcare Providers: https://www.woods-mathews.com/ This test is not yet approved or cleared by the [place] FDA and  has been authorized for detection and/or diagnosis of SARS-CoV-2 by FDA under an Emergency Use Authorization (EUA). This EUA will remain  in effect (meaning this test can be used) for the duration of the COVID-19 declaration under Section 56 4(b)(1) of the Act, 21 U.S.C. section 360bbb-3(b)(1), unless the authorization is terminated or revoked sooner. Performed at [facility]  [facility], [place] [address] [place], [place] [address]      Labs: Basic Metabolic Panel: Recent Labs  Lab 05/17/19 1853 05/17/19 1859  NA 136 139  K 3.6 3.4*  CL 107 106  CO2 19*  --   GLUCOSE 91 88  BUN 15 16  CREATININE 1.03* 0.80  CALCIUM 9.1  --    Liver Function Tests: Recent Labs  Lab 05/17/19 1853  AST 19  ALT 14  ALKPHOS 69  BILITOT 0.8  PROT 8.1  ALBUMIN 4.1   No results for input(s): LIPASE, AMYLASE in the last 168 hours. No results for input(s): AMMONIA in the last 168 hours. CBC: Recent Labs  Lab 05/17/19 1853 05/17/19 1859  WBC 12.5*  --   NEUTROABS 6.7  --   HGB 14.4 15.3*  HCT 42.6 45.0  MCV 81.8  --    PLT 297  --    Cardiac Enzymes: No results for input(s): CKTOTAL, CKMB, CKMBINDEX, TROPONINI in the last 168 hours. BNP: BNP (last 3 results) No results for input(s): BNP in the last 8760 hours.  ProBNP (last 3 results) No results for input(s): PROBNP in the last 8760 hours.  CBG: Recent Labs  Lab 05/17/19 1852  GLUCAP 93       Signed:  Alma Friendly, MD [facility] 05/18/2019, 2:26 PM

## 2019-05-22 ENCOUNTER — Encounter: Payer: Self-pay | Admitting: Internal Medicine

## 2019-05-22 ENCOUNTER — Ambulatory Visit: Payer: Self-pay | Attending: Internal Medicine | Admitting: Internal Medicine

## 2019-05-22 ENCOUNTER — Other Ambulatory Visit: Payer: Self-pay

## 2019-05-22 VITALS — BP 138/90 | HR 83 | Temp 98.9°F | Resp 18 | Ht 69.0 in | Wt 217.0 lb

## 2019-05-22 DIAGNOSIS — Z2821 Immunization not carried out because of patient refusal: Secondary | ICD-10-CM

## 2019-05-22 DIAGNOSIS — F329 Major depressive disorder, single episode, unspecified: Secondary | ICD-10-CM

## 2019-05-22 DIAGNOSIS — I10 Essential (primary) hypertension: Secondary | ICD-10-CM

## 2019-05-22 DIAGNOSIS — F17200 Nicotine dependence, unspecified, uncomplicated: Secondary | ICD-10-CM

## 2019-05-22 DIAGNOSIS — H9191 Unspecified hearing loss, right ear: Secondary | ICD-10-CM

## 2019-05-22 DIAGNOSIS — F419 Anxiety disorder, unspecified: Secondary | ICD-10-CM

## 2019-05-22 MED ORDER — LOSARTAN POTASSIUM 50 MG PO TABS: 50.0000 mg | ORAL_TABLET | Freq: Every day | ORAL | 6 refills | Status: DC

## 2019-05-22 MED ORDER — NICOTINE 21 MG/24HR TD PT24: 21.0000 mg | MEDICATED_PATCH | Freq: Every day | TRANSDERMAL | 0 refills | Status: DC

## 2019-05-22 MED ORDER — HYDROCHLOROTHIAZIDE 12.5 MG PO CAPS: 12.5000 mg | ORAL_CAPSULE | Freq: Every day | ORAL | 5 refills | Status: DC

## 2019-05-22 MED ORDER — DULOXETINE HCL 20 MG PO CPEP: 20.0000 mg | ORAL_CAPSULE | Freq: Every day | ORAL | 3 refills | Status: DC

## 2019-05-22 MED ORDER — AMLODIPINE BESYLATE 10 MG PO TABS: 10.0000 mg | ORAL_TABLET | Freq: Every day | ORAL | 6 refills | Status: DC

## 2019-05-22 MED FILL — NICOTINE 21 MG/24HR PATCH: 21 | 28 days supply | Qty: 28 | Fill #0

## 2019-05-22 MED FILL — DULoxetine HCL 20 MG CPEP: 20 | 30 days supply | Qty: 30 | Fill #0

## 2019-05-22 NOTE — Patient Instructions (Signed)
Please give patient an appointment with the licensed clinical social worker next week.  Your blood pressure is not at goal.  Increase amlodipine to 10 mg daily.  For the depression and anxiety, we have started you on a medication called Cymbalta that you will take at bedtime.  Start the nicotine patches to help with smoking cessation.  Please complete the forms for the orange card/cone discount.

## 2019-05-22 NOTE — Progress Notes (Signed)
Patient ID: Victoria [name], female    DOB: 1971/07/15  MRN: [id number]  CC: Hospitalization Follow-up   Subjective: Victoria [name] is a 48 y.o. female who presents for new pt visit and hosp f/u Her concerns today include:  Patient with history of HTN, CVA 2016 without residual weakness, tob dep  Previous PCP was at [facility] in [place], [place].  Last seen 1 yr ago.    Patient hospitalized 9/20-21/2020 with hypertensive urgency.  She was sent to the ER via EMS with complaints of dizziness and right-sided weakness leading to a fall at work.  Systolic blood pressure was greater than 200.  Patient had been off blood pressure medication for a year.  CAT scan of the brain was negative.  MRI was also negative except for incidental finding of mild scattered opacity within the right mastoid air cells and middle ear cavity of uncertain significance.  Patient was discharged on amlodipine, HCTZ, losartan, aspirin.  HTN: Since discharge she has been checking her blood pressure 2 times a day.  Highest SBP has been 135 and DBP has been in the 90s.  She limits salt in the foods.  Denies any chest pains or shortness of breath.  No lower extremity edema.  Reports compliance with medications  Complains of not sleeping well and feeling very depressed and stressed.  Contributing factors include worrying about her brother who was recently diagnosed with cancer, relationship problems with her significant other and work stress.  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  Endorses decreased appetite, poor sleep and excessive worrying.  Denies any suicidal ideation.  Feels she would benefit from some counseling and being on medication.  Tobacco dependence: She has not smoked since she left the hospital but admits strong cravings.  She was smoking about a third a pack of day.  She feels she would need help in quitting.  Complains of decreased hearing in the  right ear.  During recent hospitalization the hospitalist had examined her ear and she was found to have cerumen impaction which reportedly was removed by "EDP" per discharge summary report.  Patient states that since then she has noted decreased hearing in the ear.  Past medical, surgical, family history and social history reviewed. Patient Active Problem List   Diagnosis Date Noted  . Hypertensive urgency 05/17/2019     Current Outpatient Medications on File Prior to Visit  Medication Sig Dispense Refill  . aspirin EC 81 MG tablet Take 1 tablet (81 mg total) by mouth daily. 30 tablet 0  . Multiple Vitamins-Minerals (ADULT ONE DAILY GUMMIES) CHEW Chew 2 tablets by mouth every morning.    . naproxen sodium (ALEVE) 220 MG tablet Take 440 mg by mouth daily as needed (pain).    Vladimir Faster Glycol-Propyl Glycol (SYSTANE OP) Place 1 drop into both eyes every hour while awake.     No current facility-administered medications on file prior to visit.     Allergies  Allergen Reactions  . Penicillins Shortness Of Breath    Did it involve swelling of the face/tongue/throat, SOB, or low BP? Yes Did it involve sudden or severe rash/hives, skin peeling, or any reaction on the inside of your mouth or nose? unknown Did you need to seek medical attention at a hospital or doctor's office? Yes When did it last happen?childhood and 48 years old If all above answers are "NO", may proceed with cephalosporin use.  Marland Kitchen  Codeine Nausea And Vomiting    Social History   Socioeconomic History  . Marital status: Divorced    Spouse name: Not on file  . Number of children: Not on file  . Years of education: Not on file  . Highest education level: Not on file  Occupational History  . Not on file  Social Needs  . Financial resource strain: Not on file  . Food insecurity    Worry: Not on file    Inability: Not on file  . Transportation needs    Medical: Not on file    Non-medical: Not on file   Tobacco Use  . Smoking status: Current Some Day Smoker    Types: Cigarettes  . Smokeless tobacco: Never Used  Substance and Sexual Activity  . Alcohol use: Never    Frequency: Never  . Drug use: Never  . Sexual activity: Not on file  Lifestyle  . Physical activity    Days per week: Not on file    Minutes per session: Not on file  . Stress: Not on file  Relationships  . Social Herbalist on phone: Not on file    Gets together: Not on file    Attends religious service: Not on file    Active member of club or organization: Not on file    Attends meetings of clubs or organizations: Not on file    Relationship status: Not on file  . Intimate partner violence    Fear of current or ex partner: Not on file    Emotionally abused: Not on file    Physically abused: Not on file    Forced sexual activity: Not on file  Other Topics Concern  . Not on file  Social History Narrative  . Not on file    Family History  Problem Relation Age of Onset  . Stroke Mother   . Hypertension Mother     Past Surgical History:  Procedure Laterality Date  . CHOLECYSTECTOMY    . ECTOPIC PREGNANCY SURGERY    . MYOMECTOMY    . TUBAL LIGATION      ROS: Review of Systems Negative except as stated above  PHYSICAL EXAM: BP 138/90 (BP Location: Right Arm, Patient Position: Sitting, Cuff Size: Normal)   Pulse 83   Temp 98.9 F (37.2 C) (Oral)   Resp 18   Ht 5\' 9"  (1.753 m)   Wt 217 lb (98.4 kg)   SpO2 97%   BMI 32.05 kg/m   Physical Exam  General appearance - alert, well appearing, middle-aged African-American female and in no distress Mental status -patient appears stressed. Eyes - pupils equal and reactive, extraocular eye movements intact Ears -small amount of dried blood noted at the opening to the right ear canal.  She has moderate amount of wax at the opening of the canal obscuring view of the rest of the canal and tympanic membrane.  Left ear canal and TM within normal  limits Nose - normal and patent, no erythema, discharge or polyps Mouth - mucous membranes moist, pharynx normal without lesions Neck - supple, no significant adenopathy Chest - clear to auscultation, no wheezes, rales or rhonchi, symmetric air entry Heart - normal rate, regular rhythm, normal S1, S2, no murmurs, rubs, clicks or gallops Extremities - peripheral pulses normal, no pedal edema, no clubbing or cyanosis   CMP Latest Ref Rng & Units 05/17/2019 05/17/2019  Glucose 70 - 99 mg/dL 88 91  BUN 6 - 20 mg/dL  16 15  Creatinine 0.44 - 1.00 mg/dL 0.80 1.03(H)  Sodium 135 - 145 mmol/L 139 136  Potassium 3.5 - 5.1 mmol/L 3.4(L) 3.6  Chloride 98 - 111 mmol/L 106 107  CO2 22 - 32 mmol/L - 19(L)  Calcium 8.9 - 10.3 mg/dL - 9.1  Total Protein 6.5 - 8.1 g/dL - 8.1  Total Bilirubin 0.3 - 1.2 mg/dL - 0.8  Alkaline Phos 38 - 126 U/L - 69  AST 15 - 41 U/L - 19  ALT 0 - 44 U/L - 14   Lipid Panel  No results found for: CHOL, TRIG, HDL, CHOLHDL, VLDL, LDLCALC, LDLDIRECT  CBC    Component Value Date/Time   WBC 12.5 (H) 05/17/2019 1853   RBC 5.21 (H) 05/17/2019 1853   HGB 15.3 (H) 05/17/2019 1859   HCT 45.0 05/17/2019 1859   PLT 297 05/17/2019 1853   MCV 81.8 05/17/2019 1853   MCH 27.6 05/17/2019 1853   MCHC 33.8 05/17/2019 1853   RDW 14.1 05/17/2019 1853   LYMPHSABS 4.7 (H) 05/17/2019 1853   MONOABS 0.7 05/17/2019 1853   EOSABS 0.2 05/17/2019 1853   BASOSABS 0.1 05/17/2019 1853    ASSESSMENT AND PLAN: 1. Essential hypertension Not at goal.  Increase amlodipine to 10 mg daily.  Continue HCTZ and lovastatin at current dose.  DASH diet discussed and encouraged.  Continue to monitor blood pressure with goal being 130/80 or lower. - amLODipine (NORVASC) 10 MG tablet; Take 1 tablet (10 mg total) by mouth daily.  Dispense: 30 tablet; Refill: 6 - hydrochlorothiazide (MICROZIDE) 12.5 MG capsule; Take 1 capsule (12.5 mg total) by mouth daily.  Dispense: 30 capsule; Refill: 5 - losartan  (COZAAR) 50 MG tablet; Take 1 tablet (50 mg total) by mouth daily.  Dispense: 30 tablet; Refill: 6 - Lipid panel  2. Tobacco dependence Advised to quit.  Discussed health risks associated with smoking.  She is ready to give a trial of quitting and would like to try the NRT which she used in the past.  Discussed stepdown approach using nicotine patches.  Less than 5 minutes spent on counseling - nicotine (NICODERM CQ - DOSED IN MG/24 HOURS) 21 mg/24hr patch; Place 1 patch (21 mg total) onto the skin daily.  Dispense: 28 patch; Refill: 0  3. Anxiety and depression Discussed getting her in with our LCSW for some counseling session.  Appointment to be made when she goes through checkout today.  She is also agreeable to starting low-dose of Cymbalta.  I told patient that it can take about 4 weeks before she starts feeling better on the medication.  We will start low and increase the dose if needed - DULoxetine (CYMBALTA) 20 MG capsule; Take 1 capsule (20 mg total) by mouth daily.  Dispense: 30 capsule; Refill: 3  4. Decreased hearing of right ear Advised to use some over-the-counter wax softener in the ear and on next visit we can try flushing the ear.  Also advised that she apply for the orange card/cone discount in the event we need to refer to ENT for further evaluation of MRI findings  5. Influenza vaccination declined  30 minutes spent with patient in direct face-to-face evaluation discussing diagnosis and treatment and coordinating care. Patient was given the opportunity to ask questions.  Patient verbalized understanding of the plan and was able to repeat key elements of the plan.   Orders Placed This Encounter  Procedures  . Lipid panel     Requested Prescriptions   Signed  Prescriptions Disp Refills  . amLODipine (NORVASC) 10 MG tablet 30 tablet 6    Sig: Take 1 tablet (10 mg total) by mouth daily.  . hydrochlorothiazide (MICROZIDE) 12.5 MG capsule 30 capsule 5    Sig: Take 1  capsule (12.5 mg total) by mouth daily.  Marland Kitchen losartan (COZAAR) 50 MG tablet 30 tablet 6    Sig: Take 1 tablet (50 mg total) by mouth daily.  . nicotine (NICODERM CQ - DOSED IN MG/24 HOURS) 21 mg/24hr patch 28 patch 0    Sig: Place 1 patch (21 mg total) onto the skin daily.  . DULoxetine (CYMBALTA) 20 MG capsule 30 capsule 3    Sig: Take 1 capsule (20 mg total) by mouth daily.    Return in about 6 weeks (around 07/03/2019).  Karle Plumber, MD, FACP

## 2019-05-22 NOTE — Progress Notes (Signed)
Patient complains of right side hearing loss after her fall from the elevated BP. Patient complains of intermittent right leg pain after fall and short distances.

## 2019-05-23 ENCOUNTER — Other Ambulatory Visit: Payer: Self-pay | Admitting: Internal Medicine

## 2019-05-23 LAB — LIPID PANEL
Chol/HDL Ratio: 4 ratio (ref 0.0–4.4)
Cholesterol, Total: 210 mg/dL — ABNORMAL HIGH (ref 100–199)
HDL: 52 mg/dL (ref >39)
LDL Chol Calc (NIH): 134 mg/dL — ABNORMAL HIGH (ref 0–99)
Triglycerides: 135 mg/dL (ref 0–149)
VLDL Cholesterol Cal: 24 mg/dL (ref 5–40)

## 2019-05-23 MED ORDER — ATORVASTATIN CALCIUM 20 MG PO TABS: 20.0000 mg | ORAL_TABLET | Freq: Every day | ORAL | 6 refills | Status: DC

## 2019-05-25 MED FILL — ATORVASTATIN CALCIUM 20 MG: 20 | 30 days supply | Qty: 30 | Fill #0

## 2019-05-27 ENCOUNTER — Ambulatory Visit: Payer: Self-pay | Attending: Internal Medicine | Admitting: Licensed Clinical Social Worker

## 2019-05-27 ENCOUNTER — Telehealth: Payer: Self-pay | Admitting: Licensed Clinical Social Worker

## 2019-05-27 NOTE — Telephone Encounter (Signed)
Call placed to patient regarding scheduled appointment. LCSW left message for a return call.

## 2019-06-02 ENCOUNTER — Telehealth: Payer: Self-pay | Admitting: *Deleted

## 2019-06-02 NOTE — Telephone Encounter (Signed)
-----   Message from Ladell Pier, MD sent at 05/23/2019  1:58 PM EDT ----- Let pt know that her LDL cholesterol is 134 with goal being less than 70.  I recommend starting Lipitor.  It will lower cholesterol and dec risk for recurrent stroke.  Rxn sent to our pharmacy for her to pick up.

## 2019-06-02 NOTE — Telephone Encounter (Signed)
Medical Assistant left message on patient's home and cell voicemail. Voicemail states to give a call back to Netanel Yannuzzi with [facility] at [phone].  

## 2019-07-01 ENCOUNTER — Other Ambulatory Visit: Payer: Self-pay

## 2019-07-01 ENCOUNTER — Encounter (HOSPITAL_COMMUNITY): Payer: Self-pay

## 2019-07-01 ENCOUNTER — Ambulatory Visit (HOSPITAL_COMMUNITY)
Admission: EM | Admit: 2019-07-01 | Discharge: 2019-07-01 | Disposition: A | Discharge status: Home / Self Care | Payer: Self-pay | Attending: Family Medicine | Admitting: Family Medicine

## 2019-07-01 DIAGNOSIS — L923 Foreign body granuloma of the skin and subcutaneous tissue: Secondary | ICD-10-CM

## 2019-07-01 DIAGNOSIS — L089 Local infection of the skin and subcutaneous tissue, unspecified: Secondary | ICD-10-CM

## 2019-07-01 DIAGNOSIS — B9689 Other specified bacterial agents as the cause of diseases classified elsewhere: Secondary | ICD-10-CM

## 2019-07-01 DIAGNOSIS — I10 Essential (primary) hypertension: Secondary | ICD-10-CM

## 2019-07-01 MED ORDER — PREDNISONE 10 MG (21) PO TBPK: ORAL_TABLET | Freq: Every day | ORAL | 0 refills | Status: DC

## 2019-07-01 MED ORDER — DOXYCYCLINE HYCLATE 100 MG PO CAPS: 100.0000 mg | ORAL_CAPSULE | Freq: Two times a day (BID) | ORAL | 0 refills | Status: DC

## 2019-07-01 MED FILL — predniSONE 10 MG TABS: 10 | 6 days supply | Qty: 21 | Fill #0

## 2019-07-01 MED FILL — ?DOXYCYCLINE HYCLATE 100MG: 100 | 10 days supply | Qty: 20 | Fill #0

## 2019-07-01 NOTE — ED Triage Notes (Signed)
Pt states she got a tattoo a week ago. Pt states the tattoo is infected its on her right arm. Pt states she has been using Vaseline, pt states its very painful.

## 2019-07-01 NOTE — ED Notes (Signed)
172/112 rt arm this b/p was reported to Dr Mannie Stabile.

## 2019-07-01 NOTE — Discharge Instructions (Addendum)
Your blood pressure was noted to be elevated during your visit today. You may return here within the next few days to recheck if unable to see your primary care doctor. If your blood pressure remains persistently elevated, you may need to adjust your medications.  BP (!) 172/112 (BP Location: Right Arm)    Pulse 76    Temp 98.3 F (36.8 C) (Oral)    Resp 16    Wt 95.3 kg    SpO2 99%    BMI 31.01 kg/m

## 2019-07-02 ENCOUNTER — Encounter (HOSPITAL_COMMUNITY): Payer: Self-pay | Admitting: Obstetrics and Gynecology

## 2019-07-03 ENCOUNTER — Other Ambulatory Visit: Payer: Self-pay

## 2019-07-03 ENCOUNTER — Ambulatory Visit: Payer: Self-pay | Attending: Internal Medicine | Admitting: Internal Medicine

## 2019-07-04 NOTE — ED Provider Notes (Signed)
[facility]   [id number] 07/01/19 Arrival Time: [id number]  ASSESSMENT & PLAN:  1. Tattoo reaction   2. Skin infection, bacterial   3. Essential hypertension     Begin: Meds ordered this encounter  Medications  . doxycycline (VIBRAMYCIN) 100 MG capsule    Sig: Take 1 capsule (100 mg total) by mouth 2 (two) times daily.    Dispense:  20 capsule    Refill:  0  . predniSONE (STERAPRED UNI-PAK 21 TAB) 10 MG (21) TBPK tablet    Sig: Take by mouth daily. Take as directed.    Dispense:  21 tablet    Refill:  0     Follow-up Information    Ladell Pier, MD.   Specialty: Internal Medicine Why: If worsening or failing to improve as anticipated. And to recheck BP. Contact information: [place] [address] [phone]        [facility].   Specialty: Urgent Care Why: As needed. Contact information: [place] [phone]          Reviewed expectations re: course of current medical issues. Questions answered. Outlined signs and symptoms indicating need for more acute intervention. Patient verbalized understanding. After Visit Summary given.   SUBJECTIVE:  Victoria [name] is a 48 y.o. female who presents with a skin complaint.   Location: R forearm; new tattoo here last week; questions infection Onset: gradual Duration: several days; increasing erythema "and bumps" with significant itching; no drainage; distal tattoo with "crusting" Associated pruritis? significant Associated pain? minimal Progression: stable  Drainage? none Recent travel? No  Other associated symptoms: none Therapies tried thus far: none Arthralgia or myalgia? none Recent illness? none Fever? none New medications? none No specific aggravating or alleviating factors reported.  Increased blood pressure noted today. Reports that she is treated for HTN.  She reports taking medications  as instructed, no medication side effects noted, no chest pain on exertion, no dyspnea on exertion, no swelling of ankles, no orthostatic dizziness or lightheadedness, no orthopnea or paroxysmal nocturnal dyspnea, no palpitations and no intermittent claudication symptoms.  ROS: As per HPI.  OBJECTIVE: Vitals:   07/01/19 1212 07/01/19 1216  BP:  (!) 172/112  Pulse:  76  Resp:  16  Temp:  98.3 F (36.8 C)  TempSrc:  Oral  SpO2:  99%  Weight: 95.3 kg     General appearance: alert; no distress HEENT: [place]; AT Neck: supple with FROM Lungs: clear to auscultation bilaterally Heart: regular rate and rhythm Abd: soft Extremities: no edema; moves all extremities normally Skin: warm and dry; newly inked tattoo on R forearm; distal with yellowish crusting; no drainage or bleeding; also with what looks like a surrounding folliculitis Neuro: normal RUE sensation and grip Psychological: alert and cooperative; normal mood and affect  Allergies  Allergen Reactions  . Penicillins Shortness Of Breath    Did it involve swelling of the face/tongue/throat, SOB, or low BP? Yes Did it involve sudden or severe rash/hives, skin peeling, or any reaction on the inside of your mouth or nose? unknown Did you need to seek medical attention at a hospital or doctor's office? Yes When did it last happen?childhood and 48 years old If all above answers are "NO", may proceed with cephalosporin use.  . Codeine Other (See Comments)    "messes with my heartbeat"  . Codeine Nausea And Vomiting  . Penicillins Other (See Comments)    "messes  with my heartbeat"    Past Medical History:  Diagnosis Date  . Asthma   . Diabetes mellitus without complication ([facility])   . Fibroid, uterine   . Gallstone    stone in  gallbladder  . High cholesterol   . HTN (hypertension)   . Hypertension   . MI (myocardial infarction) ([facility])   . Multiple body piercings 02-05-13  . S/P laparoscopy (9/28) 05/24/2013  . Stroke [facility])     Residual mild R sided weakness   Social History   Socioeconomic History  . Marital status: Divorced    Spouse name: Not on file  . Number of children: Not on file  . Years of education: Not on file  . Highest education level: Not on file  Occupational History  . Not on file  Social Needs  . Financial resource strain: Not on file  . Food insecurity    Worry: Not on file    Inability: Not on file  . Transportation needs    Medical: Not on file    Non-medical: Not on file  Tobacco Use  . Smoking status: Current Some Day Smoker    Types: Cigarettes  . Smokeless tobacco: Never Used  Substance and Sexual Activity  . Alcohol use: Never    Frequency: Never  . Drug use: Never    Types: Marijuana    Comment: rare- occ. -will not use in 5 days of surgery  . Sexual activity: Yes    Birth control/protection: Surgical, None  Lifestyle  . Physical activity    Days per week: Not on file    Minutes per session: Not on file  . Stress: Not on file  Relationships  . Social Herbalist on phone: Not on file    Gets together: Not on file    Attends religious service: Not on file    Active member of club or organization: Not on file    Attends meetings of clubs or organizations: Not on file    Relationship status: Not on file  . Intimate partner violence    Fear of current or ex partner: Not on file    Emotionally abused: Not on file    Physically abused: Not on file    Forced sexual activity: Not on file  Other Topics Concern  . Not on file  Social History Narrative   ** Merged History Encounter **       Family History  Problem Relation Age of Onset  . Stroke Mother   . Hypertension Mother   . Diabetes Father   . Heart failure Father   . Hypertension Father   . Cancer Paternal Grandmother        breast  . Cancer Paternal Grandfather        lung   Past Surgical History:  Procedure Laterality Date  . CHOLECYSTECTOMY N/A 02/16/2013   Procedure: LAPAROSCOPIC  CHOLECYSTECTOMY WITH INTRAOPERATIVE CHOLANGIOGRAM;  Surgeon: Imogene Burn. Georgette Dover, MD;  Location: [facility];  Service: General;  Laterality: N/A;  . CHOLECYSTECTOMY    . DILATATION & CURRETTAGE/HYSTEROSCOPY WITH RESECTOCOPE N/A 05/22/2013   Procedure: DILATATION & CURETTAGE/HYSTEROSCOPY WITH RESECTOCOPE;  Surgeon: Marvene Staff, MD;  Location: [facility];  Service: Gynecology;  Laterality: N/A;  . DILATION AND CURETTAGE OF UTERUS    . ECTOPIC PREGNANCY SURGERY  2001   left tube removed  . ECTOPIC PREGNANCY SURGERY    . FOOT SURGERY Left 2000  . HYSTEROSCOPY N/A 07/15/2013   Procedure: HYSTEROSCOPY  MONOPOLAR RESECTION OF RESIDUAL SUBMUCOSAL MYOMA WITH SLENDER RESECTOSCOPE/INTEROP HSG   INSERTION OF SEPRA FILM STENT WITH C- ARM LYSIS OF INTRA UTERINE ADHESIONS AND ULTRASOUND;  Surgeon: Governor Specking, MD;  Location: [facility];  Service: Gynecology;  Laterality: N/A;  . LAPAROSCOPY N/A 05/24/2013   Procedure: LAPAROSCOPY DIAGNOSTIC Serita Kyle of adhesions/ evacuation of hemoperitoneium;  Surgeon: Marvene Staff, MD;  Location: [facility];  Service: Gynecology;  Laterality: N/A;  . MYOMECTOMY    . TUBAL LIGATION       Vanessa Kick, MD 07/04/19 [phone]

## 2020-11-02 ENCOUNTER — Other Ambulatory Visit: Payer: Self-pay

## 2020-11-02 ENCOUNTER — Ambulatory Visit
Admission: RE | Admit: 2020-11-02 | Discharge: 2020-11-02 | Disposition: A | Discharge status: Home / Self Care | Payer: BLUE CROSS/BLUE SHIELD | Source: Ambulatory Visit | Attending: Family Medicine | Admitting: Family Medicine

## 2020-11-02 ENCOUNTER — Other Ambulatory Visit: Payer: Self-pay | Admitting: Family Medicine

## 2020-11-02 DIAGNOSIS — R52 Pain, unspecified: Secondary | ICD-10-CM

## 2020-11-08 ENCOUNTER — Other Ambulatory Visit: Payer: Self-pay | Admitting: Family Medicine

## 2020-11-08 ENCOUNTER — Ambulatory Visit
Admission: RE | Admit: 2020-11-08 | Discharge: 2020-11-08 | Disposition: A | Discharge status: Home / Self Care | Payer: BLUE CROSS/BLUE SHIELD | Source: Ambulatory Visit | Attending: Family Medicine | Admitting: Family Medicine

## 2020-11-08 ENCOUNTER — Other Ambulatory Visit: Payer: Self-pay

## 2020-11-08 DIAGNOSIS — M545 Low back pain, unspecified: Secondary | ICD-10-CM

## 2021-01-17 ENCOUNTER — Other Ambulatory Visit: Payer: Self-pay | Admitting: Orthopedic Surgery

## 2021-01-17 DIAGNOSIS — M25562 Pain in left knee: Secondary | ICD-10-CM

## 2021-01-17 DIAGNOSIS — M2352 Chronic instability of knee, left knee: Secondary | ICD-10-CM

## 2021-01-17 DIAGNOSIS — G8929 Other chronic pain: Secondary | ICD-10-CM

## 2021-01-18 ENCOUNTER — Other Ambulatory Visit: Payer: Self-pay

## 2021-01-18 ENCOUNTER — Ambulatory Visit
Admission: RE | Admit: 2021-01-18 | Discharge: 2021-01-18 | Disposition: A | Discharge status: Home / Self Care | Payer: BLUE CROSS/BLUE SHIELD | Source: Ambulatory Visit | Attending: Orthopedic Surgery | Admitting: Orthopedic Surgery

## 2021-01-18 DIAGNOSIS — M2352 Chronic instability of knee, left knee: Secondary | ICD-10-CM

## 2021-01-18 DIAGNOSIS — G8929 Other chronic pain: Secondary | ICD-10-CM

## 2021-05-31 ENCOUNTER — Other Ambulatory Visit: Payer: Self-pay | Admitting: Family Medicine

## 2021-05-31 DIAGNOSIS — Z1231 Encounter for screening mammogram for malignant neoplasm of breast: Secondary | ICD-10-CM

## 2022-02-01 ENCOUNTER — Other Ambulatory Visit: Payer: Self-pay | Admitting: Urology

## 2022-02-01 ENCOUNTER — Other Ambulatory Visit: Payer: Self-pay

## 2022-02-01 ENCOUNTER — Encounter (HOSPITAL_BASED_OUTPATIENT_CLINIC_OR_DEPARTMENT_OTHER): Payer: Self-pay | Admitting: Urology

## 2022-02-01 NOTE — Progress Notes (Addendum)
Spoke w/ via phone for pre-op interview---pt Lab needs dos----  urine poct , istat, ekg            Lab results------none COVID test -----patient states asymptomatic no test needed Arrive at -------630 AM 6-13-023 NPO after MN NO Solid Food.  Clear liquids from MN until--- Med rec completed Medications to take morning of surgery ----escitaloprna prn, albuterol inhaler prn/bring inhaler, amlodipine Diabetic medication -----none dos Patient instructed no nail polish to be worn day of surgery Patient instructed to bring photo id and insurance card day of surgery Patient aware to have Driver (ride ) / caregiver  boyfriend or best friend   for 24 hours after surgery  Patient Special Instructions -----pt instructed to stop 81 mg aspirin 2 days before surgery and aleve 5 days before surgery per dr pace instructions. Pre-Op special Istructions -----none Patient verbalized understanding of instructions that were given at this phone interview. Patient denies shortness of breath, chest pain, fever, cough at this phone interview.   Called dr pace office and left voice mail message with selita  for dr pace, patient has sob and "messes with my heart " reaction to penicillian and ancef is ordered pre op antibiotic for 02-06-2022 surgery.

## 2022-02-02 ENCOUNTER — Other Ambulatory Visit: Payer: Self-pay | Admitting: Urology

## 2022-02-05 NOTE — H&P (Signed)
CC/HPI: cc: Gross hematuria:, Flank pain   01/16/2022: 51 year old woman referred by her OB/GYN for 2 weeks of left low back pain associate with gross hematuria. Patient also reports abdominal pain and significant pain at the end of urination. Urinalysis today does appear consistent with infection. She is experience some nausea but no vomiting. She denies fevers and chills or history of kidney stones. She has had an endometrial ablation in the past and does not feel like this is vaginal pain. She was just recently treated for urinary tract infection and while symptoms are slightly improved they are not gone.   01/31/22: Here for cystoscopy. Urine culture from last visit was negative for bacterial growth. She is still experiencing some pelvic pain and pain after sexual intercourse. Urine color has improved.     ALLERGIES: Codeine Penicillin    MEDICATIONS: Valsartan     GU PSH: None     PSH Notes: uterine ablation   NON-GU PSH: Cholecystectomy (laparoscopic)     GU PMH: Acute Cystitis/UTI - 01/16/2022 Flank Pain - 01/16/2022 Microscopic hematuria - 01/16/2022    NON-GU PMH: Anxiety Depression Hypertension Stroke/TIA    FAMILY HISTORY: None   SOCIAL HISTORY: Marital Status: Single    REVIEW OF SYSTEMS:    GU Review Female:   Patient denies frequent urination, hard to postpone urination, burning /pain with urination, get up at night to urinate, leakage of urine, stream starts and stops, trouble starting your stream, have to strain to urinate, and being pregnant.  Gastrointestinal (Upper):   Patient denies nausea, vomiting, and indigestion/ heartburn.  Gastrointestinal (Lower):   Patient denies diarrhea and constipation.  Constitutional:   Patient denies fever, night sweats, weight loss, and fatigue.  Skin:   Patient denies skin rash/ lesion and itching.  Eyes:   Patient denies blurred vision and double vision.  Ears/ Nose/ Throat:   Patient denies sore throat and sinus problems.   Hematologic/Lymphatic:   Patient denies swollen glands and easy bruising.  Cardiovascular:   Patient denies leg swelling and chest pains.  Respiratory:   Patient denies cough and shortness of breath.  Endocrine:   Patient denies excessive thirst.  Musculoskeletal:   Patient denies back pain and joint pain.  Neurological:   Patient denies headaches and dizziness.  Psychologic:   Patient denies depression and anxiety.   VITAL SIGNS: None   MULTI-SYSTEM PHYSICAL EXAMINATION:    Constitutional: Well-nourished. No physical deformities. Normally developed. Good grooming.  Neck: Neck symmetrical, not swollen. Normal tracheal position.  Respiratory: No labored breathing, no use of accessory muscles.   Skin: No paleness, no jaundice, no cyanosis. No lesion, no ulcer, no rash.  Neurologic / Psychiatric: Oriented to time, oriented to place, oriented to person. No depression, no anxiety, no agitation.  Eyes: Normal conjunctivae. Normal eyelids.  Ears, Nose, Mouth, and Throat: Left ear no scars, no lesions, no masses. Right ear no scars, no lesions, no masses. Nose no scars, no lesions, no masses. Normal hearing. Normal lips.  Musculoskeletal: Normal gait and station of head and neck.     Complexity of Data:  Records Review:   Previous Patient Records, POC Tool  Urine Test Review:   Urinalysis, Urine Culture  X-Ray Review: C.T. Abdomen/Pelvis: Reviewed Films. Reviewed Report. Discussed With Patient. IMPRESSION:  1. Punctate nonobstructive bilateral renal stones measure 1-2 mm. No  obstructive ureteral or bladder calculi identified.  2. Sigmoid colonic diverticulosis without findings of acute  diverticulitis.  3. Hepatic steatosis.  4. Aortic Atherosclerosis (ICD10-I70.0).  Electronically Signed  By: Dahlia Bailiff M.D.  On: 01/17/2022 14:40     Notes:                     10/24/2020: BUN 15, creatinine 0.90   PROCEDURES:         Flexible Cystoscopy - 52000  Risks, benefits, and some of  the potential complications of the procedure were discussed at length with the patient including infection, bleeding, voiding discomfort, urinary retention, fever, chills, sepsis, and others. All questions were answered. Informed consent was obtained. Antibiotic prophylaxis was given. Sterile technique and intraurethral analgesia were used.  Meatus:  Normal size. Normal location. Normal condition.  Urethra:  No hypermobility. No leakage.  Ureteral Orifices:  Normal location. Normal size. Normal shape. Effluxed clear urine.  Bladder:  No trabeculation. Normal mucosa. No stones. 3 areas on posterior and left bladder wall - erythematous and slightly raised concerning for possible CIS      The lower urinary tract was carefully examined. The procedure was well-tolerated and without complications. Antibiotic instructions were given. Instructions were given to call the office immediately for bloody urine, difficulty urinating, urinary retention, painful or frequent urination, fever, chills, nausea, vomiting or other illness. The patient stated that she understood these instructions and would comply with them.         Urinalysis w/Scope Dipstick Dipstick Cont'd Micro  Color: Yellow Bilirubin: Neg mg/dL WBC/hpf: 0 - 5/hpf  Appearance: Cloudy Ketones: Neg mg/dL RBC/hpf: 3 - 10/hpf  Specific Gravity: 1.025 Blood: 3+ ery/uL Bacteria: Few (10-25/hpf)  pH: <=5.0 Protein: Neg mg/dL Cystals: NS (Not Seen)  Glucose: Neg mg/dL Urobilinogen: 0.2 mg/dL Casts: NS (Not Seen)    Nitrites: Neg Trichomonas: Not Present    Leukocyte Esterase: Neg leu/uL Mucous: Present      Epithelial Cells: 0 - 5/hpf      Yeast: NS (Not Seen)      Sperm: Not Present    ASSESSMENT:      ICD-10 Details  1 GU:   Microscopic hematuria - R31.1 Undiagnosed New Problem   PLAN:           Orders Labs Cytology w/reflex FISH          Document Letter(s):  Created for Patient: Clinical Summary         Notes:   You were  bladderMicroscopic hematuria: Patient's urinalysis overall looks improved today however there is still microscopic hematuria present. I will send this for urine cytology. Cystoscopy did reveal The Posterior Wall. I Discussed with Patient Repeating a Cystoscopy in 1 Month Versus Cystoscopy with Bladder Biopsy and Fulguration. Risks and Benefits of the Procedure Were Discussed with the Patient in Detail. She Is Elected to Proceed with Bladder Biopsy with Fulguration. These risks include but are not limited to pain, bleeding, infection, damage to the bladder and surrounding structures, need for Foley catheter, bladder perforation, need for additional treatment.

## 2022-02-05 NOTE — Anesthesia Preprocedure Evaluation (Addendum)
Anesthesia Evaluation  Patient identified by MRN, date of birth, ID band Patient awake    Reviewed: Allergy & Precautions, NPO status , Patient's Chart, lab work & pertinent test results  History of Anesthesia Complications Negative for: history of anesthetic complications  Airway Mallampati: I  TM Distance: >3 FB Neck ROM: Full    Dental  (+) Dental Advisory Given, Teeth Intact   Pulmonary COPD,  COPD inhaler, former smoker,    breath sounds clear to auscultation       Cardiovascular hypertension, Pt. on medications (-) angina(-) Past MI  Rhythm:Regular Rate:Normal     Neuro/Psych  Headaches, Depression CVA (mild R weakness)    GI/Hepatic Neg liver ROS, GERD  Controlled,  Endo/Other  diabetes (glu 118), Oral Hypoglycemic AgentsMorbid obesity  Renal/GU Renal InsufficiencyRenal disease     Musculoskeletal   Abdominal   Peds  Hematology negative hematology ROS (+)   Anesthesia Other Findings   Reproductive/Obstetrics                            Anesthesia Physical Anesthesia Plan  ASA: 3  Anesthesia Plan: General   Post-op Pain Management: Tylenol PO (pre-op)*   Induction: Intravenous  PONV Risk Score and Plan: 3 and Ondansetron, Dexamethasone and Scopolamine patch - Pre-op  Airway Management Planned: LMA  Additional Equipment:   Intra-op Plan:   Post-operative Plan:   Informed Consent: I have reviewed the patients History and Physical, chart, labs and discussed the procedure including the risks, benefits and alternatives for the proposed anesthesia with the patient or authorized representative who has indicated his/her understanding and acceptance.     Dental advisory given  Plan Discussed with: CRNA and Surgeon  Anesthesia Plan Comments:        Anesthesia Quick Evaluation

## 2022-02-06 ENCOUNTER — Encounter (HOSPITAL_BASED_OUTPATIENT_CLINIC_OR_DEPARTMENT_OTHER): Payer: Self-pay | Admitting: Urology

## 2022-02-06 ENCOUNTER — Ambulatory Visit (HOSPITAL_BASED_OUTPATIENT_CLINIC_OR_DEPARTMENT_OTHER)
Admission: RE | Admit: 2022-02-06 | Discharge: 2022-02-06 | Disposition: A | Discharge status: Home / Self Care | Payer: BC Managed Care – PPO | Attending: Urology | Admitting: Urology

## 2022-02-06 ENCOUNTER — Encounter (HOSPITAL_BASED_OUTPATIENT_CLINIC_OR_DEPARTMENT_OTHER)
Admission: RE | Disposition: A | Discharge status: Home / Self Care | Payer: Self-pay | Source: Home / Self Care | Attending: Urology

## 2022-02-06 ENCOUNTER — Ambulatory Visit (HOSPITAL_BASED_OUTPATIENT_CLINIC_OR_DEPARTMENT_OTHER): Payer: BC Managed Care – PPO | Admitting: Anesthesiology

## 2022-02-06 ENCOUNTER — Other Ambulatory Visit: Payer: Self-pay

## 2022-02-06 DIAGNOSIS — Z7984 Long term (current) use of oral hypoglycemic drugs: Secondary | ICD-10-CM | POA: Insufficient documentation

## 2022-02-06 DIAGNOSIS — K219 Gastro-esophageal reflux disease without esophagitis: Secondary | ICD-10-CM | POA: Insufficient documentation

## 2022-02-06 DIAGNOSIS — Z87891 Personal history of nicotine dependence: Secondary | ICD-10-CM | POA: Insufficient documentation

## 2022-02-06 DIAGNOSIS — R310 Gross hematuria: Secondary | ICD-10-CM | POA: Diagnosis present

## 2022-02-06 DIAGNOSIS — E119 Type 2 diabetes mellitus without complications: Secondary | ICD-10-CM | POA: Insufficient documentation

## 2022-02-06 DIAGNOSIS — I10 Essential (primary) hypertension: Secondary | ICD-10-CM | POA: Insufficient documentation

## 2022-02-06 DIAGNOSIS — N329 Bladder disorder, unspecified: Secondary | ICD-10-CM | POA: Insufficient documentation

## 2022-02-06 DIAGNOSIS — Z87440 Personal history of urinary (tract) infections: Secondary | ICD-10-CM | POA: Insufficient documentation

## 2022-02-06 DIAGNOSIS — Z8673 Personal history of transient ischemic attack (TIA), and cerebral infarction without residual deficits: Secondary | ICD-10-CM | POA: Insufficient documentation

## 2022-02-06 DIAGNOSIS — J449 Chronic obstructive pulmonary disease, unspecified: Secondary | ICD-10-CM | POA: Insufficient documentation

## 2022-02-06 DIAGNOSIS — Z01818 Encounter for other preprocedural examination: Secondary | ICD-10-CM

## 2022-02-06 HISTORY — DX: Other malaise: R53.81

## 2022-02-06 HISTORY — DX: Bladder disorder, unspecified: N32.9

## 2022-02-06 HISTORY — PX: CYSTOSCOPY WITH BIOPSY: SHX5122

## 2022-02-06 HISTORY — DX: Constipation, unspecified: K59.00

## 2022-02-06 HISTORY — DX: Depression, unspecified: F32.A

## 2022-02-06 HISTORY — PX: CYSTOSCOPY WITH FULGERATION: SHX6638

## 2022-02-06 HISTORY — DX: Headache, unspecified: R51.9

## 2022-02-06 HISTORY — DX: Chronic kidney disease, unspecified: N18.9

## 2022-02-06 HISTORY — DX: Unspecified tear of unspecified meniscus, current injury, left knee, initial encounter: S83.207A

## 2022-02-06 LAB — POCT PREGNANCY, URINE: Preg Test, Ur: NEGATIVE

## 2022-02-06 LAB — POCT I-STAT, CHEM 8
BUN: 22 mg/dL — ABNORMAL HIGH (ref 6–20)
Calcium, Ion: 1.25 mmol/L (ref 1.15–1.40)
Chloride: 108 mmol/L (ref 98–111)
Creatinine, Ser: 0.9 mg/dL (ref 0.44–1.00)
Glucose, Bld: 118 mg/dL — ABNORMAL HIGH (ref 70–99)
HCT: 41 % (ref 36.0–46.0)
Hemoglobin: 13.9 g/dL (ref 12.0–15.0)
Potassium: 3.6 mmol/L (ref 3.5–5.1)
Sodium: 144 mmol/L (ref 135–145)
TCO2: 23 mmol/L (ref 22–32)

## 2022-02-06 LAB — GLUCOSE, CAPILLARY: Glucose-Capillary: 121 mg/dL — ABNORMAL HIGH (ref 70–99)

## 2022-02-06 SURGERY — CYSTOSCOPY, WITH BIOPSY
Anesthesia: General | Site: Bladder

## 2022-02-06 MED ORDER — PROPOFOL 10 MG/ML IV BOLUS
INTRAVENOUS | Status: DC | PRN
  Administered 2022-02-06: 200 mg via INTRAVENOUS

## 2022-02-06 MED ORDER — FENTANYL CITRATE (PF) 100 MCG/2ML IJ SOLN
INTRAMUSCULAR | Status: AC
  Filled 2022-02-06: qty 2

## 2022-02-06 MED ORDER — ONDANSETRON HCL 4 MG/2ML IJ SOLN
INTRAMUSCULAR | Status: AC
  Filled 2022-02-06: qty 2

## 2022-02-06 MED ORDER — CIPROFLOXACIN IN D5W 400 MG/200ML IV SOLN
INTRAVENOUS | Status: AC
  Filled 2022-02-06: qty 200

## 2022-02-06 MED ORDER — MIDAZOLAM HCL 2 MG/2ML IJ SOLN
INTRAMUSCULAR | Status: DC | PRN
  Administered 2022-02-06: 2 mg via INTRAVENOUS

## 2022-02-06 MED ORDER — FENTANYL CITRATE (PF) 100 MCG/2ML IJ SOLN: 25.0000 ug | INTRAMUSCULAR | Status: DC | PRN

## 2022-02-06 MED ORDER — SODIUM CHLORIDE 0.9 % IV SOLN: INTRAVENOUS | Status: DC

## 2022-02-06 MED ORDER — ONDANSETRON HCL 4 MG/2ML IJ SOLN
INTRAMUSCULAR | Status: DC | PRN
  Administered 2022-02-06: 4 mg via INTRAVENOUS

## 2022-02-06 MED ORDER — LIDOCAINE HCL (PF) 2 % IJ SOLN
INTRAMUSCULAR | Status: AC
  Filled 2022-02-06: qty 5

## 2022-02-06 MED ORDER — OXYCODONE HCL 5 MG/5ML PO SOLN: 5.0000 mg | Freq: Once | ORAL | Status: DC | PRN

## 2022-02-06 MED ORDER — MEPERIDINE HCL 25 MG/ML IJ SOLN: 6.2500 mg | INTRAMUSCULAR | Status: DC | PRN

## 2022-02-06 MED ORDER — MIDAZOLAM HCL 2 MG/2ML IJ SOLN
INTRAMUSCULAR | Status: AC
  Filled 2022-02-06: qty 2

## 2022-02-06 MED ORDER — PHENAZOPYRIDINE HCL 200 MG PO TABS: 200.0000 mg | ORAL_TABLET | Freq: Three times a day (TID) | ORAL | 0 refills | Status: AC | PRN

## 2022-02-06 MED ORDER — MIDAZOLAM HCL 2 MG/2ML IJ SOLN: 0.5000 mg | Freq: Once | INTRAMUSCULAR | Status: DC | PRN

## 2022-02-06 MED ORDER — ACETAMINOPHEN 500 MG PO TABS
ORAL_TABLET | ORAL | Status: AC
  Filled 2022-02-06: qty 2

## 2022-02-06 MED ORDER — OXYCODONE HCL 5 MG PO TABS: 5.0000 mg | ORAL_TABLET | Freq: Once | ORAL | Status: DC | PRN

## 2022-02-06 MED ORDER — STERILE WATER FOR IRRIGATION IR SOLN
Status: DC | PRN
  Administered 2022-02-06: 3000 mL

## 2022-02-06 MED ORDER — FENTANYL CITRATE (PF) 100 MCG/2ML IJ SOLN
INTRAMUSCULAR | Status: DC | PRN
Start: 2022-02-06 — End: 2022-02-06
  Administered 2022-02-06: 50 ug via INTRAVENOUS

## 2022-02-06 MED ORDER — CIPROFLOXACIN IN D5W 400 MG/200ML IV SOLN
400.0000 mg | Freq: Two times a day (BID) | INTRAVENOUS | Status: DC
  Administered 2022-02-06: 400 mg via INTRAVENOUS

## 2022-02-06 MED ORDER — PROPOFOL 10 MG/ML IV BOLUS
INTRAVENOUS | Status: AC
  Filled 2022-02-06: qty 20

## 2022-02-06 MED ORDER — LIDOCAINE 2% (20 MG/ML) 5 ML SYRINGE
INTRAMUSCULAR | Status: DC | PRN
  Administered 2022-02-06: 30 mg via INTRAVENOUS

## 2022-02-06 MED ORDER — HYDROCODONE-ACETAMINOPHEN 5-325 MG PO TABS: 1.0000 | ORAL_TABLET | ORAL | 0 refills | Status: DC | PRN

## 2022-02-06 MED ORDER — ACETAMINOPHEN 500 MG PO TABS: 1000.0000 mg | ORAL_TABLET | Freq: Once | ORAL | Status: DC

## 2022-02-06 SURGICAL SUPPLY — 13 items
BAG DRAIN URO-CYSTO SKYTR STRL (DRAIN) ×2 IMPLANT
BAG DRN UROCATH (DRAIN) ×1
CLOTH BEACON ORANGE TIMEOUT ST (SAFETY) ×2 IMPLANT
ELECT REM PT RETURN 9FT ADLT (ELECTROSURGICAL) ×2
ELECTRODE REM PT RTRN 9FT ADLT (ELECTROSURGICAL) ×1 IMPLANT
GLOVE BIO SURGEON STRL SZ 6.5 (GLOVE) ×2 IMPLANT
GOWN STRL REUS W/TWL LRG LVL3 (GOWN DISPOSABLE) ×2 IMPLANT
KIT TURNOVER CYSTO (KITS) ×2 IMPLANT
MANIFOLD NEPTUNE II (INSTRUMENTS) ×2 IMPLANT
PACK CYSTO (CUSTOM PROCEDURE TRAY) ×2 IMPLANT
TUBE CONNECTING 12X1/4 (SUCTIONS) ×2 IMPLANT
TUBING UROLOGY SET (TUBING) ×1 IMPLANT
WATER STERILE IRR 3000ML UROMA (IV SOLUTION) ×2 IMPLANT

## 2022-02-06 NOTE — Interval H&P Note (Signed)
History and Physical Interval Note:  02/06/2022 8:03 AM  Victoria [name]  has presented today for surgery, with the diagnosis of BLADDER LESION.  The various methods of treatment have been discussed with the patient and family. After consideration of risks, benefits and other options for treatment, the patient has consented to  Procedure(s): CYSTOSCOPY WITH BIOPSY (N/A) CYSTOSCOPY WITH FULGERATION (N/A) as a surgical intervention.  The patient's history has been reviewed, patient examined, no change in status, stable for surgery.  I have reviewed the patient's chart and labs.  Questions were answered to the patient's satisfaction.     Jourdain Guay D Dajohn Ellender

## 2022-02-06 NOTE — Op Note (Signed)
Operative Note  Preoperative diagnosis:  1.  Bladder lesion  Postoperative diagnosis: 1.  Bladder lesion  Procedure(s): 1.  Cystoscopy, bladder biopsy, fulguration 2 cm  Surgeon: Jacalyn Lefevre, MD  Assistants:  None  Anesthesia:  General  Complications:  None  EBL:  minmial  Specimens: 1. Posterior wall bladder biopsy 2.  Left lateral wall bladder biopsy  Drains/Catheters: 1.  none  Intraoperative findings:   Normal urethra Bilateral orthotopic ureteral orifices 2 cm area of irregular erythematous bladder mucosa on posterior superior wall  Indication:  Victoria [name] is a 51 y.o. female who initially presented with gross hematuria and left flank pain.  Upper tract imaging was negative for any source of hematuria.  Cystoscopy showed abnormal bladder mucosa on the posterior wall.  Description of procedure:  After risks and benefits of the procedure discussed with patient in detail, informed consent was obtained.  The patient was taken to the operating room placed in supine position.  Anesthesia was induced and antibiotics were administered.  The patient was then repositioned in the dorsolithotomy position.  She was prepped and draped in the usual sterile fashion and timeout was performed.  A 23 French rigid cystoscope was placed in urethra meatus and advanced in the bladder.  Findings are noted above.  A cold cup bladder biopsy was then obtained from the posterior bladder wall as well as left lateral wall.  The area was then fulgurated with the [place].  Hemostasis was adequate with the irrigant turned off.  The patient's bladder was decompressed and the cystoscope was removed.  The patient emerged from anesthesia and transferred the PACU in stable condition.  Plan:  Patient to void in PACU and be discharged home.

## 2022-02-06 NOTE — Anesthesia Procedure Notes (Signed)
Procedure Name: LMA Insertion Date/Time: 02/06/2022 8:27 AM  Performed by: Suan Halter, CRNAPre-anesthesia Checklist: Patient identified, Emergency Drugs available, Suction available and Patient being monitored Patient Re-evaluated:Patient Re-evaluated prior to induction Oxygen Delivery Method: Circle system utilized Preoxygenation: Pre-oxygenation with 100% oxygen Induction Type: IV induction Ventilation: Mask ventilation without difficulty LMA: LMA inserted LMA Size: 4.0 Number of attempts: 1 Airway Equipment and Method: Bite block Placement Confirmation: positive ETCO2 Tube secured with: Tape Dental Injury: Teeth and Oropharynx as per pre-operative assessment

## 2022-02-06 NOTE — Discharge Instructions (Addendum)
Cystoscopy with Bladder biopsy and Fulguration   Definition:  Sampling bladder tissue that appeared abnormal to examine it under microscope.  General instructions:     Your recent bladder surgery requires very little post hospital care but some definite precautions.  Despite the fact that no skin incisions were used, the area around the bladder incisions are raw and covered with scabs to promote healing and prevent bleeding. Certain precautions are needed to insure that the scabs are not disturbed over the next 2-4 weeks while the healing proceeds.  Because the raw surface inside your bladder and the irritating effects of urine you may expect frequency of urination and/or urgency (a stronger desire to urinate) and perhaps even getting up at night more often. This will usually resolve or improve slowly over the healing period. You may see some blood in your urine over the first 6 weeks. Do not be alarmed, even if the urine was clear for a while. Get off your feet and drink lots of fluids until clearing occurs. If you start to pass clots or don't improve call us.  Catheter: (If you are discharged with a catheter.)  1. Keep your catheter secured to your leg at all times with tape or the supplied strap. 2. You may experience leakage of urine around your catheter- as long as the  catheter continues to drain, this is normal.  If your catheter stops draining  go to the ER. 3. You may also have blood in your urine, even after it has been clear for  several days; you may even pass some small blood clots or other material.  This  is normal as well.  If this happens, sit down and drink plenty of water to help  make urine to flush out your bladder.  If the blood in your urine becomes worse  after doing this, contact our office or return to the ER. 4. You may use the leg bag (small bag) during the day, but use the large bag at  night.  Diet:  You may return to your normal diet immediately. Because  of the raw surface of your bladder, alcohol, spicy foods, foods high in acid and drinks with caffeine may cause irritation or frequency and should be used in moderation. To keep your urine flowing freely and avoid constipation, drink plenty of fluids during the day (8-10 glasses). Tip: Avoid cranberry juice because it is very acidic.  Activity:  Your physical activity doesn't need to be restricted. However, if you are very active, you may see some blood in the urine. We suggest that you reduce your activity under the circumstances until the bleeding has stopped.  Bowels:  It is important to keep your bowels regular during the postoperative period. Straining with bowel movements can cause bleeding. A bowel movement every other day is reasonable. Use a mild laxative if needed, such as milk of magnesia 2-3 tablespoons, or 2 Dulcolax tablets. Call if you continue to have problems. If you had been taking narcotics for pain, before, during or after your surgery, you may be constipated. Take a laxative if necessary.    Medication:  You should resume your pre-surgery medications unless told not to. In addition you may be given an antibiotic to prevent or treat infection. Antibiotics are not always necessary. All medication should be taken as prescribed until the bottles are finished unless you are having an unusual reaction to one of the drugs.    Post Anesthesia Home Care Instructions  Activity: Get  plenty of rest for the remainder of the day. A responsible individual must stay with you for 24 hours following the procedure.  For the next 24 hours, DO NOT: -Drive a car -Paediatric nurse -Drink alcoholic beverages -Take any medication unless instructed by your physician -Make any legal decisions or sign important papers.  Meals: Start with liquid foods such as gelatin or soup. Progress to regular foods as tolerated. Avoid greasy, spicy, heavy foods. If nausea and/or vomiting occur, drink only  clear liquids until the nausea and/or vomiting subsides. Call your physician if vomiting continues.  Special Instructions/Symptoms: Your throat may feel dry or sore from the anesthesia or the breathing tube placed in your throat during surgery. If this causes discomfort, gargle with warm salt water. The discomfort should disappear within 24 hours.

## 2022-02-06 NOTE — Anesthesia Postprocedure Evaluation (Signed)
Anesthesia Post Note  Patient: Victoria [name]  Procedure(s) Performed: CYSTOSCOPY WITH BIOPSY (Bladder) CYSTOSCOPY WITH FULGERATION (Bladder)     Patient location during evaluation: PACU Anesthesia Type: General Level of consciousness: awake and alert, patient cooperative and oriented Pain management: pain level controlled Vital Signs Assessment: post-procedure vital signs reviewed and stable Respiratory status: spontaneous breathing, nonlabored ventilation and respiratory function stable Cardiovascular status: blood pressure returned to baseline and stable Postop Assessment: no apparent nausea or vomiting, able to ambulate and adequate PO intake Anesthetic complications: no   No notable events documented.  Last Vitals:  Vitals:   02/06/22 0900 02/06/22 0915  BP: (!) 138/91 (!) 159/99  Pulse: 65 64  Resp: 13 14  Temp:  36.4 C  SpO2: 100% 100%    Last Pain:  Vitals:   02/06/22 0915  TempSrc:   PainSc: 0-No pain                 Shambria Camerer,E. Gionna Polak

## 2022-02-06 NOTE — Transfer of Care (Signed)
Immediate Anesthesia Transfer of Care Note  Patient: Victoria [name]  Procedure(s) Performed: Procedure(s) (LRB): CYSTOSCOPY WITH BIOPSY (N/A) CYSTOSCOPY WITH FULGERATION (N/A)  Patient Location: PACU  Anesthesia Type: General  Level of Consciousness: awake, oriented, sedated and patient cooperative  Airway & Oxygen Therapy: Patient Spontanous Breathing and Patient connected to face mask oxygen  Post-op Assessment: Report given to PACU RN and Post -op Vital signs reviewed and stable  Post vital signs: Reviewed and stable  Complications: No apparent anesthesia complications Last Vitals:  Vitals Value Taken Time  BP 141/81 02/06/22 0851  Temp    Pulse 68 02/06/22 0853  Resp 14 02/06/22 0853  SpO2 100 % 02/06/22 0853  Vitals shown include unvalidated device data.  Last Pain:  Vitals:   02/06/22 0706  TempSrc: Oral  PainSc: 0-No pain      Patients Stated Pain Goal: 4 (79/39/03 0092)  Complications: No notable events documented.

## 2022-02-07 ENCOUNTER — Encounter (HOSPITAL_BASED_OUTPATIENT_CLINIC_OR_DEPARTMENT_OTHER): Payer: Self-pay | Admitting: Urology

## 2022-02-07 LAB — SURGICAL PATHOLOGY

## 2022-07-27 DIAGNOSIS — Z419 Encounter for procedure for purposes other than remedying health state, unspecified: Secondary | ICD-10-CM | POA: Diagnosis not present

## 2022-08-27 DIAGNOSIS — Z419 Encounter for procedure for purposes other than remedying health state, unspecified: Secondary | ICD-10-CM | POA: Diagnosis not present

## 2022-09-27 DIAGNOSIS — Z419 Encounter for procedure for purposes other than remedying health state, unspecified: Secondary | ICD-10-CM | POA: Diagnosis not present

## 2022-10-26 DIAGNOSIS — Z419 Encounter for procedure for purposes other than remedying health state, unspecified: Secondary | ICD-10-CM | POA: Diagnosis not present

## 2022-11-06 ENCOUNTER — Other Ambulatory Visit: Payer: Self-pay | Admitting: Plastic Surgery

## 2022-11-06 DIAGNOSIS — Z1231 Encounter for screening mammogram for malignant neoplasm of breast: Secondary | ICD-10-CM

## 2022-11-14 ENCOUNTER — Ambulatory Visit
Admission: RE | Admit: 2022-11-14 | Discharge: 2022-11-14 | Disposition: A | Discharge status: Home / Self Care | Payer: BC Managed Care – PPO | Source: Ambulatory Visit | Attending: Plastic Surgery | Admitting: Plastic Surgery

## 2022-11-14 DIAGNOSIS — Z1231 Encounter for screening mammogram for malignant neoplasm of breast: Secondary | ICD-10-CM

## 2022-11-16 ENCOUNTER — Ambulatory Visit: Payer: BC Managed Care – PPO

## 2022-11-19 ENCOUNTER — Other Ambulatory Visit: Payer: Self-pay | Admitting: Plastic Surgery

## 2022-11-19 DIAGNOSIS — R928 Other abnormal and inconclusive findings on diagnostic imaging of breast: Secondary | ICD-10-CM

## 2022-11-26 DIAGNOSIS — Z419 Encounter for procedure for purposes other than remedying health state, unspecified: Secondary | ICD-10-CM | POA: Diagnosis not present

## 2022-11-28 ENCOUNTER — Other Ambulatory Visit: Payer: Self-pay | Admitting: Plastic Surgery

## 2022-11-28 ENCOUNTER — Ambulatory Visit
Admission: RE | Admit: 2022-11-28 | Discharge: 2022-11-28 | Disposition: A | Discharge status: Home / Self Care | Payer: BC Managed Care – PPO | Source: Ambulatory Visit | Attending: Plastic Surgery | Admitting: Plastic Surgery

## 2022-11-28 ENCOUNTER — Encounter: Payer: Self-pay | Admitting: Family Medicine

## 2022-11-28 DIAGNOSIS — R928 Other abnormal and inconclusive findings on diagnostic imaging of breast: Secondary | ICD-10-CM

## 2022-11-28 DIAGNOSIS — N6489 Other specified disorders of breast: Secondary | ICD-10-CM

## 2022-11-28 DIAGNOSIS — N6310 Unspecified lump in the right breast, unspecified quadrant: Secondary | ICD-10-CM

## 2022-12-03 ENCOUNTER — Ambulatory Visit
Admission: RE | Admit: 2022-12-03 | Discharge: 2022-12-03 | Disposition: A | Discharge status: Home / Self Care | Payer: BC Managed Care – PPO | Source: Ambulatory Visit | Attending: Plastic Surgery | Admitting: Plastic Surgery

## 2022-12-03 ENCOUNTER — Other Ambulatory Visit: Payer: Self-pay | Admitting: Plastic Surgery

## 2022-12-03 DIAGNOSIS — N6310 Unspecified lump in the right breast, unspecified quadrant: Secondary | ICD-10-CM

## 2022-12-03 DIAGNOSIS — N6489 Other specified disorders of breast: Secondary | ICD-10-CM

## 2022-12-03 DIAGNOSIS — R928 Other abnormal and inconclusive findings on diagnostic imaging of breast: Secondary | ICD-10-CM

## 2022-12-03 HISTORY — PX: BREAST BIOPSY: SHX20

## 2022-12-17 ENCOUNTER — Encounter (HOSPITAL_COMMUNITY): Payer: Self-pay

## 2022-12-19 ENCOUNTER — Ambulatory Visit: Payer: BC Managed Care – PPO

## 2022-12-26 DIAGNOSIS — Z419 Encounter for procedure for purposes other than remedying health state, unspecified: Secondary | ICD-10-CM | POA: Diagnosis not present

## 2023-01-16 ENCOUNTER — Other Ambulatory Visit: Payer: Self-pay

## 2023-01-16 ENCOUNTER — Encounter (HOSPITAL_BASED_OUTPATIENT_CLINIC_OR_DEPARTMENT_OTHER): Payer: Self-pay | Admitting: Plastic Surgery

## 2023-01-17 MED ORDER — CHLORHEXIDINE GLUCONATE CLOTH 2 % EX PADS: 6.0000 | MEDICATED_PAD | Freq: Once | CUTANEOUS | Status: DC

## 2023-01-17 NOTE — Progress Notes (Signed)

## 2023-01-17 NOTE — H&P (Signed)
Subjective:    Patient ID: Victoria [name] is a 52 y.o. female.   HPI   Returns for follow up discussion breast reduction. Current 46 DD. Reports several year history neck back shoulder pain. Has tried specialty fitted bras, NSAIDs, regular exercise, hot/cold backs for over 6 month trial without change. Reports excoriations beneath breasts recurrent that has failed trial hygiene methods, placing Kleenex beneath breasts, antifungal powder and baby powder for over 3 month trial without improvement.   Has participated in monitored weight loss program with PCP including diet modification and regular activity for over 3 months last year.  weeks at this time. On Ozempic- weight down 11 lb over last year.   MMG 10/2022 showed possible left breast mass. Diagnostic MMG/US showed 1 x 0.4 x 1.2 cm mass with cystic areas at the 1 o'clock position 7 cmfn. Biopsy benign. PGM with breast ca.   PMH significant for HTN, DM, HbA1c 5.9 12/2022, hx CVA 7-10 years ago with no residual on ASA 81 mg.   Quit smoking 2022.   Works for [employer]. Works 12 shifts, notes largely sitting. Has to use fork lift for 2 hours. Lives alone. Boyfriend to assist with post op care.   Review of Systems  Constitutional:       + alopecia  Eyes: Positive for visual disturbance.  Respiratory: Positive for shortness of breath and wheezing.   Endocrine: Positive for cold intolerance, heat intolerance and polyuria.  Musculoskeletal: Positive for back pain and neck pain.    Remainder 12 point review negative   Objective:  Physical Exam Cardiovascular:     Rate and Rhythm: Normal rate.  Pulmonary:     Effort: Pulmonary effort is normal.  Lymphadenopathy:     Upper Body:     Right upper body: No axillary adenopathy.     Left upper body: No axillary adenopathy.  Skin:    Comments: Fitzpatrick 5  Neurological:     Mental Status: She is oriented to person, place, and time.      +shoulder grooving Breasts: no  masses grade 3 ptosis bilateral SN to nipple R 35 L 36 cm BW R 24 L 24 cm Nipple to IMF R 14 L 15 cm   Assessment:    Macromastia Chronic neck and back pain Intertrigo   Plan:    Hold Ozempic, ASA week prior to surgery.   Chronic neck and back pain, intertrigo that has failed conservative measures in setting of macromastia including monitored weight loss program. No other cause of back pain noted. There is a reasonable likelihood that the patient's symptoms are primarily due to macromastia. Breast reduction surgery has reasonable expectation to relieve chronic pain and excoriation symptoms.   Reviewed reduction with anchor type scars, OP surgery, drains, post operative visits and limitations, recovery. Diminished sensation nipple and breast skin, risk of nipple loss, wound healing problems, asymmetry, incidental carcinoma, changes with wt gain/loss, aging, unacceptable cosmetic appearance reviewed. Cannot assure cup size. Reviewed breast reduction will not change chest circumference- this will come with weight loss. Recommend weight stabilization prior to surgery- reviewed significant weight loss following surgery will lead to early recurrent ptosis.    Additional risks including but not limited to bleeding seroma hematoma damage to adjacent structures blood clots in legs or lungs, need for additional procedures, infection reviewed. Completed ASPS consent for breast reduction.   Drain teaching completed. Rx for tramadol given.   Anticipate 912 g reduction from each breast.   Irean Hong  Leta Baptist, MD [facility]  Office/ physician access line after hours [phone]

## 2023-01-24 NOTE — Anesthesia Preprocedure Evaluation (Addendum)
Anesthesia Evaluation  Patient identified by MRN, date of birth, ID band Patient awake    Reviewed: Allergy & Precautions, NPO status , Patient's Chart, lab work & pertinent test results  Airway Mallampati: I  TM Distance: >3 FB Neck ROM: Full   Comment: Previous grade I view with [facility] 2 Dental  (+) Dental Advisory Given   Pulmonary neg shortness of breath, asthma (no recent inhaler use) , neg sleep apnea, neg COPD, neg recent URI, former smoker   Pulmonary exam normal breath sounds clear to auscultation       Cardiovascular hypertension (amlodipine, furosemide, valsartan), Pt. on medications (-) angina + Past MI  (-) Cardiac Stents and (-) CABG (-) dysrhythmias  Rhythm:Regular Rate:Normal  HLD   Neuro/Psych  Headaches, neg Seizures PSYCHIATRIC DISORDERS  Depression    CVA (7-8 years ago), No Residual Symptoms    GI/Hepatic negative GI ROS, Neg liver ROS,,,  Endo/Other  diabetes, Type 2    Renal/GU CRFRenal disease     Musculoskeletal   Abdominal  (+) + obese  Peds  Hematology negative hematology ROS (+)   Anesthesia Other Findings Last semiglutide: 01/12/2023  Reproductive/Obstetrics                             Anesthesia Physical Anesthesia Plan  ASA: 3  Anesthesia Plan: General   Post-op Pain Management: Tylenol PO (pre-op)*   Induction: Intravenous  PONV Risk Score and Plan: 3 and Ondansetron, Dexamethasone and Treatment may vary due to age or medical condition  Airway Management Planned: Oral ETT  Additional Equipment:   Intra-op Plan:   Post-operative Plan: Extubation in OR  Informed Consent: I have reviewed the patients History and Physical, chart, labs and discussed the procedure including the risks, benefits and alternatives for the proposed anesthesia with the patient or authorized representative who has indicated his/her understanding and acceptance.     Dental  advisory given  Plan Discussed with: CRNA and Anesthesiologist  Anesthesia Plan Comments: (Risks of general anesthesia discussed including, but not limited to, sore throat, hoarse voice, chipped/damaged teeth, injury to vocal cords, nausea and vomiting, allergic reactions, lung infection, heart attack, stroke, and death. All questions answered. )       Anesthesia Quick Evaluation

## 2023-01-25 ENCOUNTER — Other Ambulatory Visit: Payer: Self-pay

## 2023-01-25 ENCOUNTER — Ambulatory Visit (HOSPITAL_BASED_OUTPATIENT_CLINIC_OR_DEPARTMENT_OTHER): Payer: BC Managed Care – PPO | Admitting: Anesthesiology

## 2023-01-25 ENCOUNTER — Encounter (HOSPITAL_BASED_OUTPATIENT_CLINIC_OR_DEPARTMENT_OTHER): Payer: Self-pay | Admitting: Plastic Surgery

## 2023-01-25 ENCOUNTER — Encounter (HOSPITAL_BASED_OUTPATIENT_CLINIC_OR_DEPARTMENT_OTHER)
Admission: RE | Disposition: A | Discharge status: Home / Self Care | Payer: Self-pay | Source: Ambulatory Visit | Attending: Plastic Surgery

## 2023-01-25 ENCOUNTER — Ambulatory Visit (HOSPITAL_BASED_OUTPATIENT_CLINIC_OR_DEPARTMENT_OTHER)
Admission: RE | Admit: 2023-01-25 | Discharge: 2023-01-25 | Disposition: A | Discharge status: Home / Self Care | Payer: BC Managed Care – PPO | Source: Ambulatory Visit | Attending: Plastic Surgery | Admitting: Plastic Surgery

## 2023-01-25 DIAGNOSIS — N6032 Fibrosclerosis of left breast: Secondary | ICD-10-CM | POA: Insufficient documentation

## 2023-01-25 DIAGNOSIS — L304 Erythema intertrigo: Secondary | ICD-10-CM | POA: Diagnosis not present

## 2023-01-25 DIAGNOSIS — N189 Chronic kidney disease, unspecified: Secondary | ICD-10-CM | POA: Insufficient documentation

## 2023-01-25 DIAGNOSIS — E1122 Type 2 diabetes mellitus with diabetic chronic kidney disease: Secondary | ICD-10-CM | POA: Insufficient documentation

## 2023-01-25 DIAGNOSIS — M542 Cervicalgia: Secondary | ICD-10-CM | POA: Diagnosis not present

## 2023-01-25 DIAGNOSIS — N62 Hypertrophy of breast: Secondary | ICD-10-CM | POA: Insufficient documentation

## 2023-01-25 DIAGNOSIS — N6031 Fibrosclerosis of right breast: Secondary | ICD-10-CM | POA: Insufficient documentation

## 2023-01-25 DIAGNOSIS — G8929 Other chronic pain: Secondary | ICD-10-CM | POA: Insufficient documentation

## 2023-01-25 DIAGNOSIS — Z803 Family history of malignant neoplasm of breast: Secondary | ICD-10-CM | POA: Insufficient documentation

## 2023-01-25 DIAGNOSIS — Z8673 Personal history of transient ischemic attack (TIA), and cerebral infarction without residual deficits: Secondary | ICD-10-CM | POA: Diagnosis not present

## 2023-01-25 DIAGNOSIS — Z7985 Long-term (current) use of injectable non-insulin antidiabetic drugs: Secondary | ICD-10-CM | POA: Insufficient documentation

## 2023-01-25 DIAGNOSIS — M549 Dorsalgia, unspecified: Secondary | ICD-10-CM | POA: Insufficient documentation

## 2023-01-25 DIAGNOSIS — Z87891 Personal history of nicotine dependence: Secondary | ICD-10-CM | POA: Insufficient documentation

## 2023-01-25 DIAGNOSIS — I252 Old myocardial infarction: Secondary | ICD-10-CM | POA: Insufficient documentation

## 2023-01-25 DIAGNOSIS — Z79899 Other long term (current) drug therapy: Secondary | ICD-10-CM | POA: Insufficient documentation

## 2023-01-25 DIAGNOSIS — Z01818 Encounter for other preprocedural examination: Secondary | ICD-10-CM

## 2023-01-25 DIAGNOSIS — I129 Hypertensive chronic kidney disease with stage 1 through stage 4 chronic kidney disease, or unspecified chronic kidney disease: Secondary | ICD-10-CM | POA: Insufficient documentation

## 2023-01-25 HISTORY — PX: BREAST REDUCTION SURGERY: SHX8

## 2023-01-25 LAB — GLUCOSE, CAPILLARY
Glucose-Capillary: 89 mg/dL (ref 70–99)
Glucose-Capillary: 116 mg/dL — ABNORMAL HIGH (ref 70–99)

## 2023-01-25 LAB — POCT PREGNANCY, URINE: Preg Test, Ur: NEGATIVE

## 2023-01-25 SURGERY — MAMMOPLASTY, REDUCTION
Anesthesia: General | Site: Breast | Laterality: Bilateral

## 2023-01-25 MED ORDER — CELECOXIB 200 MG PO CAPS
200.0000 mg | ORAL_CAPSULE | ORAL | Status: AC
  Administered 2023-01-25: 200 mg via ORAL

## 2023-01-25 MED ORDER — 0.9 % SODIUM CHLORIDE (POUR BTL) OPTIME
TOPICAL | Status: DC | PRN
  Administered 2023-01-25: 400 mL

## 2023-01-25 MED ORDER — ONDANSETRON HCL 4 MG/2ML IJ SOLN
INTRAMUSCULAR | Status: AC
  Filled 2023-01-25: qty 2

## 2023-01-25 MED ORDER — CELECOXIB 200 MG PO CAPS
ORAL_CAPSULE | ORAL | Status: AC
  Filled 2023-01-25: qty 1

## 2023-01-25 MED ORDER — FENTANYL CITRATE (PF) 100 MCG/2ML IJ SOLN
INTRAMUSCULAR | Status: AC
  Filled 2023-01-25: qty 2

## 2023-01-25 MED ORDER — OXYCODONE HCL 5 MG/5ML PO SOLN: 5.0000 mg | Freq: Once | ORAL | Status: AC | PRN

## 2023-01-25 MED ORDER — ROCURONIUM BROMIDE 10 MG/ML (PF) SYRINGE
PREFILLED_SYRINGE | INTRAVENOUS | Status: AC
  Filled 2023-01-25: qty 10

## 2023-01-25 MED ORDER — SUGAMMADEX SODIUM 200 MG/2ML IV SOLN
INTRAVENOUS | Status: DC | PRN
  Administered 2023-01-25: 200 mg via INTRAVENOUS

## 2023-01-25 MED ORDER — BUPIVACAINE HCL (PF) 0.5 % IJ SOLN
INTRAMUSCULAR | Status: AC
  Filled 2023-01-25: qty 30

## 2023-01-25 MED ORDER — CIPROFLOXACIN IN D5W 400 MG/200ML IV SOLN
INTRAVENOUS | Status: AC
  Filled 2023-01-25: qty 200

## 2023-01-25 MED ORDER — LIDOCAINE 2% (20 MG/ML) 5 ML SYRINGE
INTRAMUSCULAR | Status: AC
  Filled 2023-01-25: qty 5

## 2023-01-25 MED ORDER — MIDAZOLAM HCL 5 MG/5ML IJ SOLN
INTRAMUSCULAR | Status: DC | PRN
  Administered 2023-01-25: 2 mg via INTRAVENOUS

## 2023-01-25 MED ORDER — FENTANYL CITRATE (PF) 100 MCG/2ML IJ SOLN
INTRAMUSCULAR | Status: DC | PRN
  Administered 2023-01-25 (×3): 50 ug via INTRAVENOUS
  Administered 2023-01-25: 100 ug via INTRAVENOUS
  Administered 2023-01-25: 50 ug via INTRAVENOUS

## 2023-01-25 MED ORDER — LACTATED RINGERS IV SOLN: INTRAVENOUS | Status: DC

## 2023-01-25 MED ORDER — LABETALOL HCL 5 MG/ML IV SOLN
INTRAVENOUS | Status: DC | PRN
  Administered 2023-01-25: 5 mg via INTRAVENOUS

## 2023-01-25 MED ORDER — GABAPENTIN 300 MG PO CAPS
300.0000 mg | ORAL_CAPSULE | ORAL | Status: AC
  Administered 2023-01-25: 300 mg via ORAL

## 2023-01-25 MED ORDER — AMISULPRIDE (ANTIEMETIC) 5 MG/2ML IV SOLN: 10.0000 mg | Freq: Once | INTRAVENOUS | Status: DC | PRN

## 2023-01-25 MED ORDER — ROCURONIUM BROMIDE 100 MG/10ML IV SOLN
INTRAVENOUS | Status: DC | PRN
  Administered 2023-01-25: 50 mg via INTRAVENOUS

## 2023-01-25 MED ORDER — PHENYLEPHRINE 80 MCG/ML (10ML) SYRINGE FOR IV PUSH (FOR BLOOD PRESSURE SUPPORT)
PREFILLED_SYRINGE | INTRAVENOUS | Status: AC
  Filled 2023-01-25: qty 10

## 2023-01-25 MED ORDER — MIDAZOLAM HCL 2 MG/2ML IJ SOLN
INTRAMUSCULAR | Status: AC
  Filled 2023-01-25: qty 2

## 2023-01-25 MED ORDER — BUPIVACAINE HCL (PF) 0.5 % IJ SOLN
INTRAMUSCULAR | Status: DC | PRN
  Administered 2023-01-25: 30 mL

## 2023-01-25 MED ORDER — FENTANYL CITRATE (PF) 100 MCG/2ML IJ SOLN
25.0000 ug | INTRAMUSCULAR | Status: DC | PRN
  Administered 2023-01-25: 50 ug via INTRAVENOUS

## 2023-01-25 MED ORDER — PROPOFOL 10 MG/ML IV BOLUS
INTRAVENOUS | Status: DC | PRN
  Administered 2023-01-25: 200 mg via INTRAVENOUS

## 2023-01-25 MED ORDER — OXYCODONE HCL 5 MG PO TABS
5.0000 mg | ORAL_TABLET | Freq: Once | ORAL | Status: AC | PRN
  Administered 2023-01-25: 5 mg via ORAL

## 2023-01-25 MED ORDER — EPHEDRINE 5 MG/ML INJ
INTRAVENOUS | Status: AC
  Filled 2023-01-25: qty 5

## 2023-01-25 MED ORDER — ATROPINE SULFATE 0.4 MG/ML IV SOLN
INTRAVENOUS | Status: AC
  Filled 2023-01-25: qty 1

## 2023-01-25 MED ORDER — DEXAMETHASONE SODIUM PHOSPHATE 4 MG/ML IJ SOLN
INTRAMUSCULAR | Status: DC | PRN
  Administered 2023-01-25: 5 mg via INTRAVENOUS

## 2023-01-25 MED ORDER — DEXAMETHASONE SODIUM PHOSPHATE 10 MG/ML IJ SOLN
INTRAMUSCULAR | Status: AC
  Filled 2023-01-25: qty 1

## 2023-01-25 MED ORDER — SUCCINYLCHOLINE CHLORIDE 200 MG/10ML IV SOSY
PREFILLED_SYRINGE | INTRAVENOUS | Status: AC
  Filled 2023-01-25: qty 10

## 2023-01-25 MED ORDER — CIPROFLOXACIN IN D5W 400 MG/200ML IV SOLN
400.0000 mg | INTRAVENOUS | Status: AC
  Administered 2023-01-25: 400 mg via INTRAVENOUS

## 2023-01-25 MED ORDER — BUPIVACAINE-EPINEPHRINE (PF) 0.25% -1:200000 IJ SOLN
INTRAMUSCULAR | Status: AC
  Filled 2023-01-25: qty 30

## 2023-01-25 MED ORDER — ACETAMINOPHEN 500 MG PO TABS
ORAL_TABLET | ORAL | Status: AC
  Filled 2023-01-25: qty 2

## 2023-01-25 MED ORDER — LIDOCAINE HCL (CARDIAC) PF 100 MG/5ML IV SOSY
PREFILLED_SYRINGE | INTRAVENOUS | Status: DC | PRN
  Administered 2023-01-25: 100 mg via INTRAVENOUS

## 2023-01-25 MED ORDER — GABAPENTIN 300 MG PO CAPS
ORAL_CAPSULE | ORAL | Status: AC
  Filled 2023-01-25: qty 1

## 2023-01-25 MED ORDER — EPHEDRINE SULFATE (PRESSORS) 50 MG/ML IJ SOLN
INTRAMUSCULAR | Status: DC | PRN
  Administered 2023-01-25: 10 mg via INTRAVENOUS

## 2023-01-25 MED ORDER — ACETAMINOPHEN 500 MG PO TABS
1000.0000 mg | ORAL_TABLET | ORAL | Status: AC
  Administered 2023-01-25: 500 mg via ORAL

## 2023-01-25 MED ORDER — OXYCODONE HCL 5 MG PO TABS
ORAL_TABLET | ORAL | Status: AC
  Filled 2023-01-25: qty 1

## 2023-01-25 SURGICAL SUPPLY — 55 items
ADH SKN CLS APL DERMABOND .7 (GAUZE/BANDAGES/DRESSINGS) ×2
APL PRP STRL LF DISP 70% ISPRP (MISCELLANEOUS) ×2
BINDER BREAST 3XL (GAUZE/BANDAGES/DRESSINGS) IMPLANT
BINDER BREAST LRG (GAUZE/BANDAGES/DRESSINGS) IMPLANT
BINDER BREAST MEDIUM (GAUZE/BANDAGES/DRESSINGS) IMPLANT
BINDER BREAST XLRG (GAUZE/BANDAGES/DRESSINGS) IMPLANT
BINDER BREAST XXLRG (GAUZE/BANDAGES/DRESSINGS) IMPLANT
BLADE SURG 10 STRL SS (BLADE) ×4 IMPLANT
BNDG GAUZE DERMACEA FLUFF 4 (GAUZE/BANDAGES/DRESSINGS) ×2 IMPLANT
BNDG GZE DERMACEA 4 6PLY (GAUZE/BANDAGES/DRESSINGS) ×2
CANISTER SUCT 1200ML W/VALVE (MISCELLANEOUS) ×1 IMPLANT
CHLORAPREP W/TINT 26 (MISCELLANEOUS) ×2 IMPLANT
COVER BACK TABLE 60X90IN (DRAPES) ×1 IMPLANT
COVER MAYO STAND STRL (DRAPES) ×1 IMPLANT
DERMABOND ADVANCED .7 DNX12 (GAUZE/BANDAGES/DRESSINGS) ×2 IMPLANT
DRAIN CHANNEL 15F RND FF W/TCR (WOUND CARE) IMPLANT
DRAIN CHANNEL 19F RND (DRAIN) IMPLANT
DRAPE TOP ARMCOVERS (MISCELLANEOUS) ×1 IMPLANT
DRAPE U-SHAPE 76X120 STRL (DRAPES) ×1 IMPLANT
DRAPE UTILITY XL STRL (DRAPES) ×1 IMPLANT
ELECT COATED BLADE 2.86 ST (ELECTRODE) ×1 IMPLANT
ELECT REM PT RETURN 9FT ADLT (ELECTROSURGICAL) ×1
ELECTRODE REM PT RTRN 9FT ADLT (ELECTROSURGICAL) ×1 IMPLANT
EVACUATOR SILICONE 100CC (DRAIN) IMPLANT
GAUZE PAD ABD 8X10 STRL (GAUZE/BANDAGES/DRESSINGS) ×2 IMPLANT
GLOVE BIO SURGEON STRL SZ 6 (GLOVE) ×2 IMPLANT
GLOVE BIO SURGEON STRL SZ8 (GLOVE) IMPLANT
GLOVE BIOGEL PI IND STRL 7.0 (GLOVE) IMPLANT
GLOVE BIOGEL PI IND STRL 8 (GLOVE) IMPLANT
GOWN STRL REUS W/ TWL LRG LVL3 (GOWN DISPOSABLE) ×2 IMPLANT
GOWN STRL REUS W/ TWL XL LVL3 (GOWN DISPOSABLE) IMPLANT
GOWN STRL REUS W/TWL LRG LVL3 (GOWN DISPOSABLE) ×2
GOWN STRL REUS W/TWL XL LVL3 (GOWN DISPOSABLE) ×1
MARKER SKIN DUAL TIP RULER LAB (MISCELLANEOUS) IMPLANT
NDL HYPO 25X1 1.5 SAFETY (NEEDLE) ×1 IMPLANT
NEEDLE HYPO 25X1 1.5 SAFETY (NEEDLE) ×1 IMPLANT
NS IRRIG 1000ML POUR BTL (IV SOLUTION) ×1 IMPLANT
PACK BASIN DAY SURGERY FS (CUSTOM PROCEDURE TRAY) ×1 IMPLANT
PENCIL SMOKE EVACUATOR (MISCELLANEOUS) ×1 IMPLANT
PIN SAFETY STERILE (MISCELLANEOUS) ×1 IMPLANT
SHEET MEDIUM DRAPE 40X70 STRL (DRAPES) ×2 IMPLANT
SLEEVE SCD COMPRESS KNEE MED (STOCKING) ×1 IMPLANT
SPONGE T-LAP 18X18 ~~LOC~~+RFID (SPONGE) ×3 IMPLANT
STAPLER VISISTAT 35W (STAPLE) ×1 IMPLANT
SUT ETHILON 2 0 FS 18 (SUTURE) IMPLANT
SUT MNCRL AB 4-0 PS2 18 (SUTURE) IMPLANT
SUT VIC AB 3-0 PS1 18 (SUTURE) ×8
SUT VIC AB 3-0 PS1 18XBRD (SUTURE) IMPLANT
SUT VIC AB 4-0 PS2 18 (SUTURE) IMPLANT
SYR BULB IRRIG 60ML STRL (SYRINGE) ×1 IMPLANT
SYR CONTROL 10ML LL (SYRINGE) ×1 IMPLANT
TOWEL GREEN STERILE FF (TOWEL DISPOSABLE) ×2 IMPLANT
TUBE CONNECTING 20X1/4 (TUBING) ×1 IMPLANT
UNDERPAD 30X36 HEAVY ABSORB (UNDERPADS AND DIAPERS) ×2 IMPLANT
YANKAUER SUCT BULB TIP NO VENT (SUCTIONS) ×1 IMPLANT

## 2023-01-25 NOTE — Op Note (Signed)
Operative Note   DATE OF OPERATION: 5.31.2024  LOCATION: [facility]-outpatient  SURGICAL DIVISION: Plastic Surgery  PREOPERATIVE DIAGNOSES:  1. Macromastia 2. Chronic neck and back pain 3.Intertrigo  POSTOPERATIVE DIAGNOSES:  same  PROCEDURE:  Bilateral breast reduction  SURGEON: Glenna Fellows MD MBA  ASSISTANT: none  ANESTHESIA:  General.   EBL: 45 ml  COMPLICATIONS: None immediate.   INDICATIONS FOR PROCEDURE:  The patient, Victoria [name], is a 52 y.o. female born on May 27, 1971, is here for treatment chronic neck and back pain intertrigo in setting of macromastia that has failed conservative measures.   FINDINGS: Right reduction 519 g Left reduction 581 g  DESCRIPTION OF PROCEDURE:  The patient was marked standing in the preoperative area to mark sternal notch, chest midline, anterior axillary lines, inframammary folds. The location of new nipple areolar complex was marked at level of on inframammary fold on anterior surface breast by palpation. This was marked symmetric over bilateral breasts. With aid of Wise pattern marker, location of new nipple areolar complex and vertical limbs (8 cm) were marked by displacement of breasts along meridian. The patient was taken to the operating room. SCDs were placed and IV antibiotics were given. The patient's operative site was prepped and draped in a sterile fashion. A time out was performed and all information was confirmed to be correct.     I began on left breast. Over left breast, superior medial pedicle marked and nipple areolar complex incised with 45 mm diameter marker. Pedicle deepithlialized and developed to chest wall. Breast tissue resected over lower pole. Medial and lateral flaps developed. Additional superior and lateral breast tissue excised. Breast tailor tacked closed.    I then directed attention to right breast where superior medial pedicle designed. NAC incised with 45 mm diameter marker. The pedicle was  deepithelialized. Pedicle developed to chest wall. Breast tissue resected over lower pole. Medial and lateral flaps developed. Additional superior and lateral breast tissue excised. Breast tailor tacked closed. Patient assessed for symmetry. Breast cavities irrigated and hemostasis obtained. Local anesthetic infiltrated throughout each breast. 15 Fr JP placed in each breast and secured with 2-0 nylon. Closure completed bilateral with 3-0 vicryl to approximate dermis along inframammary fold and vertical limb. NAC inset with 3-0 vicryl in dermis. Skin closure completed with 4-0 monocryl subcuticular throughout. Tissue adhesive applied. Dry dressing and breast binder applied.  The patient was allowed to wake from anesthesia, extubated and taken to the recovery room in satisfactory condition.   SPECIMENS: right and left breast reduction  DRAINS: 15 Fr JP in right and left breast  Glenna Fellows, MD [facility] Plastic & Reconstructive Surgery  Office/ physician access line after hours [phone]

## 2023-01-25 NOTE — Discharge Instructions (Addendum)
Post Anesthesia Home Care Instructions  Activity: Get plenty of rest for the remainder of the day. A responsible individual must stay with you for 24 hours following the procedure.  For the next 24 hours, DO NOT: -Drive a car -Advertising copywriter -Drink alcoholic beverages -Take any medication unless instructed by your physician -Make any legal decisions or sign important papers.  Meals: Start with liquid foods such as gelatin or soup. Progress to regular foods as tolerated. Avoid greasy, spicy, heavy foods. If nausea and/or vomiting occur, drink only clear liquids until the nausea and/or vomiting subsides. Call your physician if vomiting continues.  Special Instructions/Symptoms: Your throat may feel dry or sore from the anesthesia or the breathing tube placed in your throat during surgery. If this causes discomfort, gargle with warm salt water. The discomfort should disappear within 24 hours.  If you had a scopolamine patch placed behind your ear for the management of post- operative nausea and/or vomiting:  1. The medication in the patch is effective for 72 hours, after which it should be removed.  Wrap patch in a tissue and discard in the trash. Wash hands thoroughly with soap and water. 2. You may remove the patch earlier than 72 hours if you experience unpleasant side effects which may include dry mouth, dizziness or visual disturbances. 3. Avoid touching the patch. Wash your hands with soap and water after contact with the patch.    *May have Tylenol/Ibuprofen at 12:40pm today   [employer] this sheet to all of your post-operative appointments while you have your drains. Please measure your drains by CC's or ML's. Make sure you drain and measure your JP Drains 2 or 3 times per day. At the end of each day, add up totals for the left side and add up totals for the right side.    ( 9 am )     ( 3 pm )        ( 9 pm )                Date L  R  L  R  L  R  Total L/R                                                                                                                                                                                       About my Jackson-Pratt Bulb Drain  What is a Jackson-Pratt bulb? A Jackson-Pratt is a soft, round device used to collect drainage. It is connected to a long, thin drainage catheter, which is held in place by one or two small stiches near your surgical incision site. When  the bulb is squeezed, it forms a vacuum, forcing the drainage to empty into the bulb.  Emptying the Jackson-Pratt bulb- To empty the bulb: 1. Release the plug on the top of the bulb. 2. Pour the bulb's contents into a measuring container which your nurse will provide. 3. Record the time emptied and amount of drainage. Empty the drain(s) as often as your     doctor or nurse recommends.  Date                  Time                    Amount (Drain 1)                 Amount (Drain 2)  _____________________________________________________________________  _____________________________________________________________________  _____________________________________________________________________  _____________________________________________________________________  _____________________________________________________________________  _____________________________________________________________________  _____________________________________________________________________  _____________________________________________________________________  Squeezing the Jackson-Pratt Bulb- To squeeze the bulb: 1. Make sure the plug at the top of the bulb is open. 2. Squeeze the bulb tightly in your fist. You will hear air squeezing from the bulb. 3. Replace the plug while the bulb is squeezed. 4. Use a safety pin to attach the bulb to your clothing. This will keep the catheter from     pulling at the bulb insertion site.  When to call your  doctor- Call your doctor if: Drain site becomes red, swollen or hot. You have a fever greater than 101 degrees F. There is oozing at the drain site. Drain falls out (apply a guaze bandage over the drain hole and secure it with tape). Drainage increases daily not related to activity patterns. (You will usually have more drainage when you are active than when you are resting.) Drainage has a bad odor.

## 2023-01-25 NOTE — Transfer of Care (Signed)
Immediate Anesthesia Transfer of Care Note  Patient: Victoria [name]  Procedure(s) Performed: MAMMARY REDUCTION  (BREAST) (Bilateral: Breast)  Patient Location: PACU  Anesthesia Type:General  Level of Consciousness: awake, alert , oriented, drowsy, and patient cooperative  Airway & Oxygen Therapy: Patient Spontanous Breathing and Patient connected to face mask oxygen  Post-op Assessment: Report given to RN and Post -op Vital signs reviewed and stable  Post vital signs: Reviewed and stable  Last Vitals:  Vitals Value Taken Time  BP 163/92 01/25/23 1049  Temp    Pulse 69 01/25/23 1052  Resp 15 01/25/23 1052  SpO2 100 % 01/25/23 1052  Vitals shown include unvalidated device data.  Last Pain:  Vitals:   01/25/23 4098  TempSrc: Temporal  PainSc: 0-No pain      Patients Stated Pain Goal: 3 (01/25/23 1191)  Complications: No notable events documented.

## 2023-01-25 NOTE — Interval H&P Note (Signed)
History and Physical Interval Note:  01/25/2023 6:41 AM  Victoria [name]  has presented today for surgery, with the diagnosis of macromastia.  The various methods of treatment have been discussed with the patient and family. After consideration of risks, benefits and other options for treatment, the patient has consented to  Procedure(s): MAMMARY REDUCTION  (BREAST) (Bilateral) as a surgical intervention.  The patient's history has been reviewed, patient examined, no change in status, stable for surgery.  I have reviewed the patient's chart and labs.  Questions were answered to the patient's satisfaction.     [name]

## 2023-01-25 NOTE — Anesthesia Postprocedure Evaluation (Signed)
Anesthesia Post Note  Patient: Victoria [name]  Procedure(s) Performed: MAMMARY REDUCTION  (BREAST) (Bilateral: Breast)     Patient location during evaluation: PACU Anesthesia Type: General Level of consciousness: awake Pain management: pain level controlled Vital Signs Assessment: post-procedure vital signs reviewed and stable Respiratory status: spontaneous breathing, nonlabored ventilation and respiratory function stable Cardiovascular status: blood pressure returned to baseline and stable Postop Assessment: no apparent nausea or vomiting Anesthetic complications: no   No notable events documented.  Last Vitals:  Vitals:   01/25/23 1120 01/25/23 1145  BP: (!) 165/84   Pulse: 75 68  Resp: 19 16  Temp:  36.5 C  SpO2: 94% 99%    Last Pain:  Vitals:   01/25/23 1145  TempSrc: Temporal  PainSc: 6                  Linton Rump

## 2023-01-25 NOTE — Anesthesia Procedure Notes (Addendum)
Procedure Name: Intubation Date/Time: 01/25/2023 7:32 AM  Performed by: Ronnette Hila, CRNAPre-anesthesia Checklist: Patient identified, Emergency Drugs available, Suction available and Patient being monitored Patient Re-evaluated:Patient Re-evaluated prior to induction Oxygen Delivery Method: Circle system utilized Preoxygenation: Pre-oxygenation with 100% oxygen Induction Type: IV induction Ventilation: Mask ventilation without difficulty Laryngoscope Size: Mac and Miller Grade View: Grade I Tube type: Oral Tube size: 7.0 mm Number of attempts: 1 Airway Equipment and Method: Stylet and Oral airway Placement Confirmation: ETT inserted through vocal cords under direct vision, positive ETCO2 and breath sounds checked- equal and bilateral Secured at: 22 cm Tube secured with: Tape Dental Injury: Teeth and Oropharynx as per pre-operative assessment

## 2023-01-26 DIAGNOSIS — Z419 Encounter for procedure for purposes other than remedying health state, unspecified: Secondary | ICD-10-CM | POA: Diagnosis not present

## 2023-01-27 ENCOUNTER — Encounter (HOSPITAL_BASED_OUTPATIENT_CLINIC_OR_DEPARTMENT_OTHER): Payer: Self-pay | Admitting: Plastic Surgery

## 2023-01-28 LAB — SURGICAL PATHOLOGY

## 2023-02-02 ENCOUNTER — Other Ambulatory Visit: Payer: Self-pay

## 2023-02-25 DIAGNOSIS — Z419 Encounter for procedure for purposes other than remedying health state, unspecified: Secondary | ICD-10-CM | POA: Diagnosis not present

## 2023-02-25 IMAGING — MR MR KNEE*L* W/O CM
4 of 6 series · 23 of 40 positions shown · non-contrast
Comparison: Left knee radiograph 11/02/2020

CLINICAL DATA: Left knee pain for 3 weeks

EXAM:
MRI OF THE LEFT KNEE WITHOUT CONTRAST
TECHNIQUE: Multiplanar, multisequence MR imaging of the knee was performed. No
intravenous contrast was administered.

[Series 3: T2 fat-sat · axial · 4.0mm · 0.62mm/px · z∈[-137,-7]mm · 7 of 27 slices shown (1 of 2)]
[im 1/27]
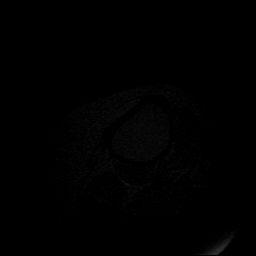
[im 5/27]
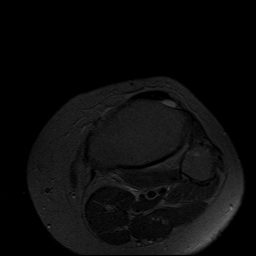
[im 9/27]
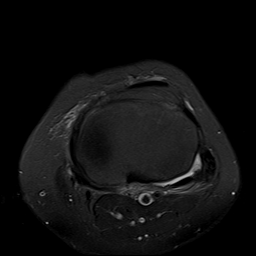
[im 14/27]
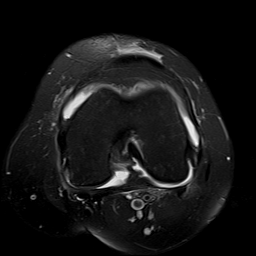
[im 18/27]
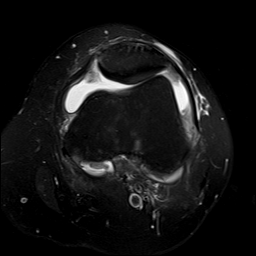
[im 22/27]
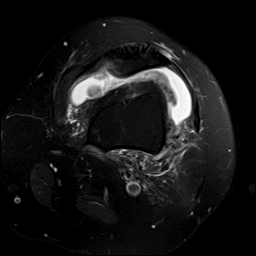
[im 27/27]
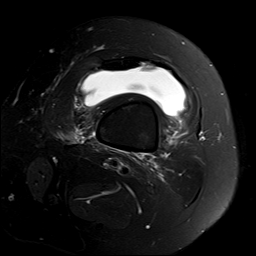

[Series 5: T2 fat-sat · coronal · 4.0mm · 0.29mm/px · 3 of 27 slices shown (2 of 2)]
[im 6/27]
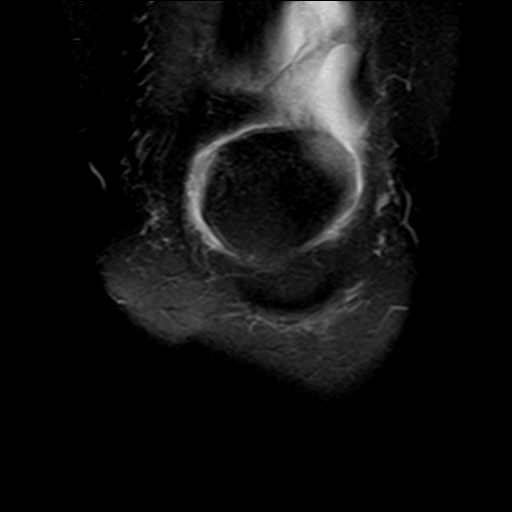
[im 16/27]
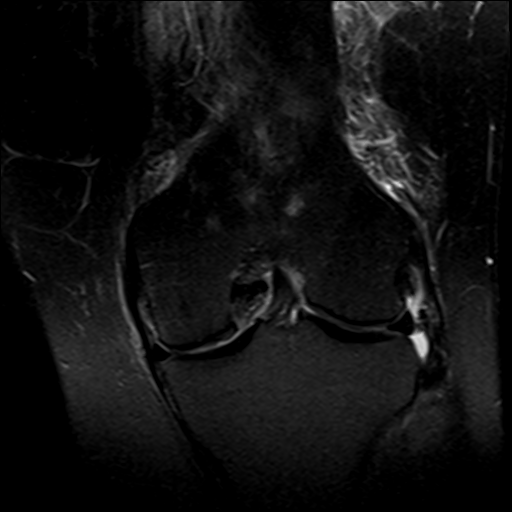
[im 27/27]
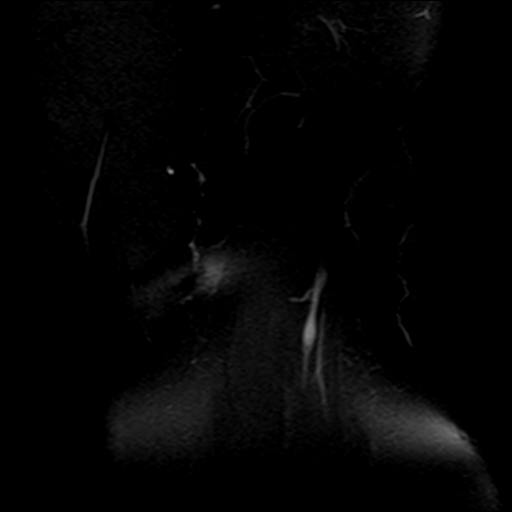

[Series 7: PD fat-sat · sagittal · 3.0mm · 0.29mm/px · 6 of 27 slices shown (1 of 2)]
[im 1/27]
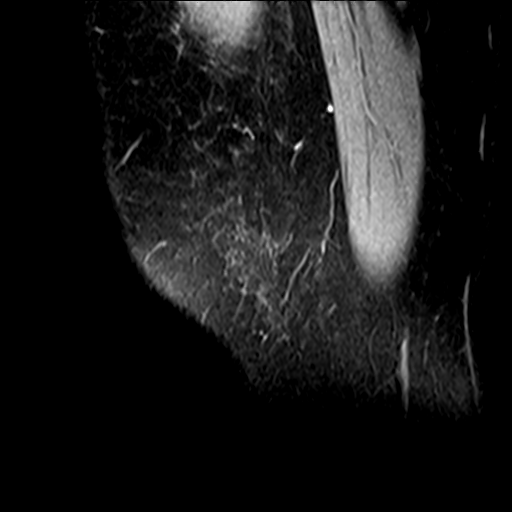
[im 6/27]
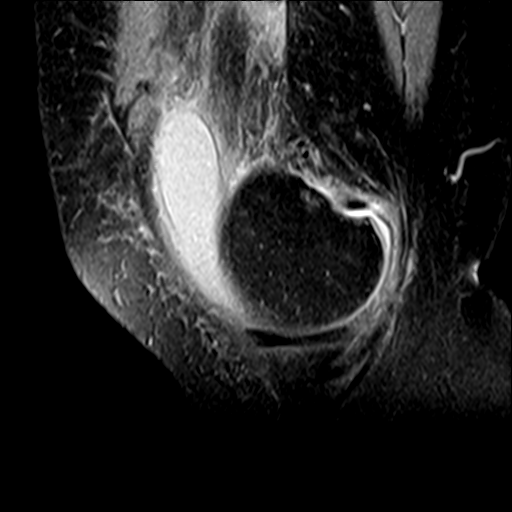
[im 11/27]
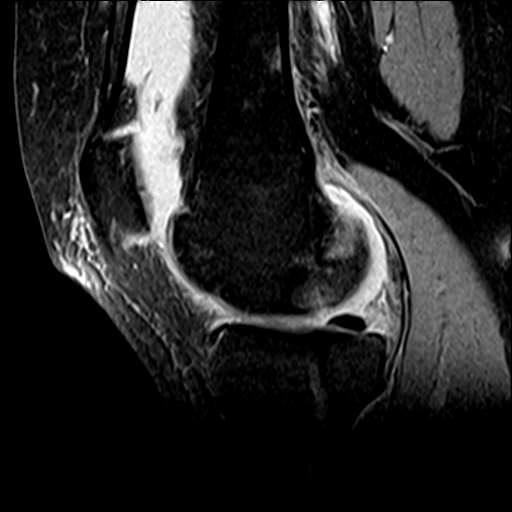
[im 16/27]
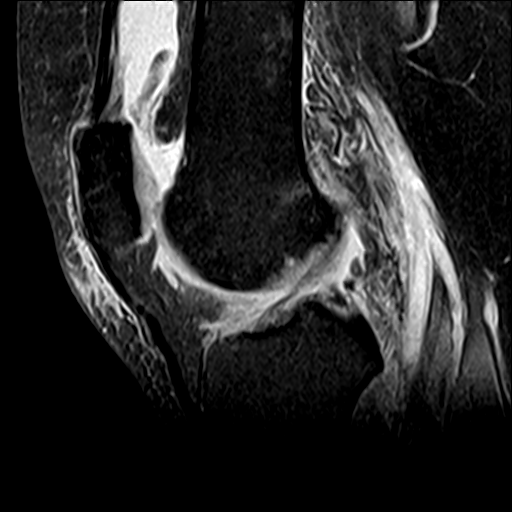
[im 21/27]
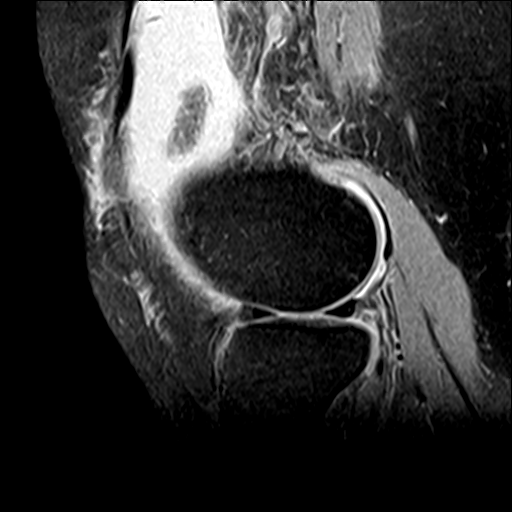
[im 27/27]
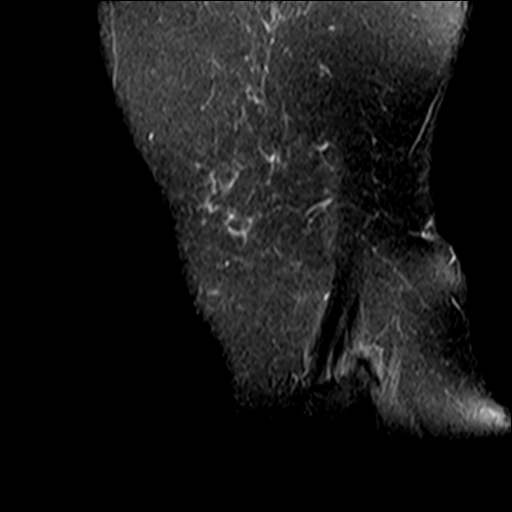

[Series 8: PD fat-sat · coronal · 3.0mm · 0.29mm/px · 7 of 32 slices shown (2 of 2)]
[im 1/32]
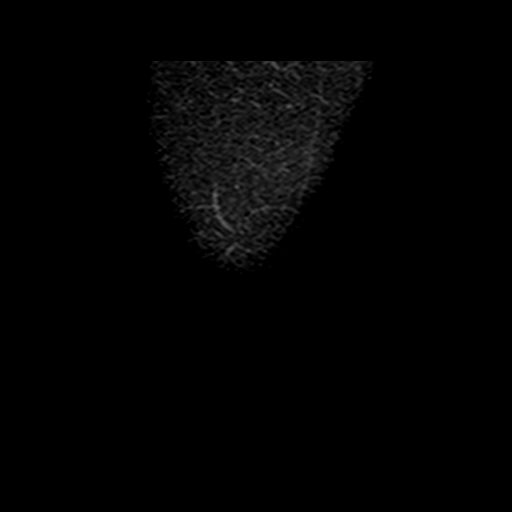
[im 6/32]
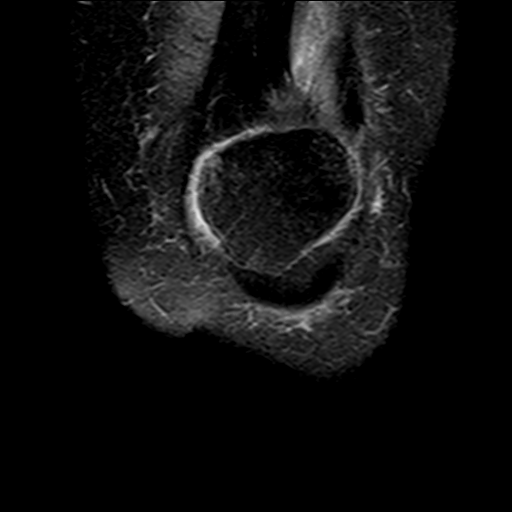
[im 11/32]
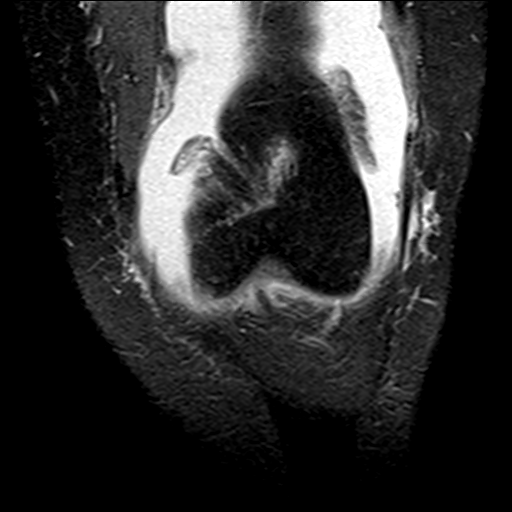
[im 16/32]
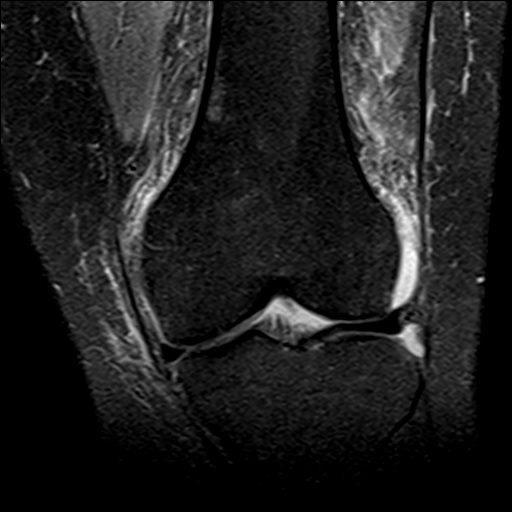
[im 21/32]
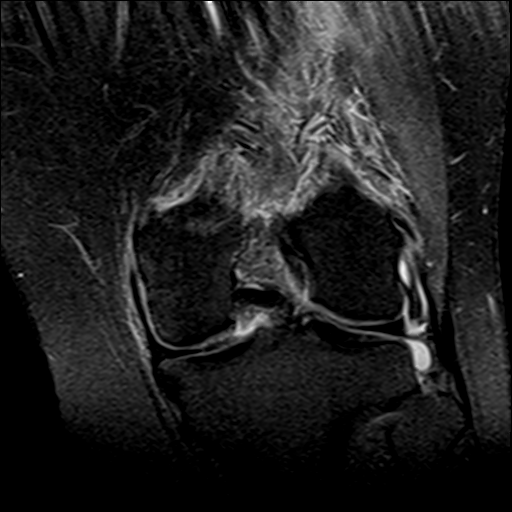
[im 26/32]
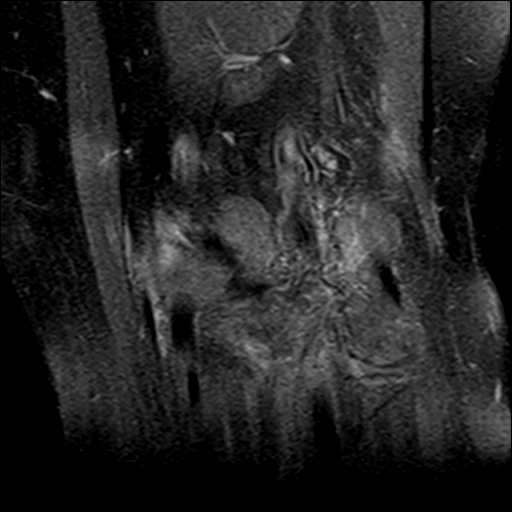
[im 32/32]
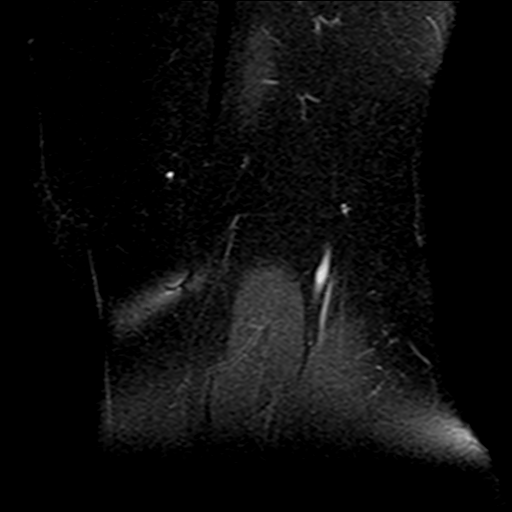

[23 of 40 positions shown; findings below may reference images not displayed]

FINDINGS: MENISCI

Medial: Degenerative signal at the posterior horn-body junction
without definitive meniscal tear. Undersurface fraying at the
posterior root.

Lateral: Intact.

LIGAMENTS

Cruciates: ACL and PCL are intact.

Collaterals: Medial collateral ligament is intact. Lateral
collateral ligament complex is intact.

CARTILAGE

Patellofemoral: There is partial-thickness fissuring along the
median patellar ridge.

Medial:  Moderate chondrosis.

Lateral:  Mild chondrosis.

JOINT: Large joint effusion with synovitis.

POPLITEAL FOSSA: Miniscule Baker cyst.

EXTENSOR MECHANISM: Intact quadriceps tendon. Intact patellar
tendon. Intact lateral patellar retinaculum. There is edema at the
insertion site of the medial retinaculum on the patella, with
possible small osteochondral injury.

BONES: No aggressive osseous lesion. No fracture or dislocation.

Other: No fluid collection or hematoma. Muscles are normal.
Generalized soft tissue swelling of the knee.
IMPRESSION: Large joint effusion with nonspecific synovitis.

Edema at the insertion of the medial retinaculum on the patella,
with adjacent possible small osteochondral injury.

Degenerative intrasubstance signal within the posterior horn of the
medial meniscus and undersurface fraying of the posterior root. No
displaced meniscal tear. Intact cruciate ligaments.

Partial-thickness fissuring along the median patellar ridge.
Moderate medial and mild lateral compartment chondrosis.

## 2023-03-12 ENCOUNTER — Other Ambulatory Visit: Payer: Self-pay | Admitting: Family Medicine

## 2023-03-12 DIAGNOSIS — R1084 Generalized abdominal pain: Secondary | ICD-10-CM

## 2023-03-13 DIAGNOSIS — N644 Mastodynia: Secondary | ICD-10-CM | POA: Diagnosis not present

## 2023-03-28 DIAGNOSIS — Z419 Encounter for procedure for purposes other than remedying health state, unspecified: Secondary | ICD-10-CM | POA: Diagnosis not present

## 2023-03-29 ENCOUNTER — Ambulatory Visit
Admission: RE | Admit: 2023-03-29 | Discharge: 2023-03-29 | Disposition: A | Discharge status: Home / Self Care | Payer: BC Managed Care – PPO | Source: Ambulatory Visit | Attending: Family Medicine | Admitting: Family Medicine

## 2023-03-29 DIAGNOSIS — R1084 Generalized abdominal pain: Secondary | ICD-10-CM

## 2023-03-29 MED ORDER — IOPAMIDOL (ISOVUE-300) INJECTION 61%
80.0000 mL | Freq: Once | INTRAVENOUS | Status: AC | PRN
  Administered 2023-03-29: 80 mL via INTRAVENOUS

## 2023-04-28 DIAGNOSIS — Z419 Encounter for procedure for purposes other than remedying health state, unspecified: Secondary | ICD-10-CM | POA: Diagnosis not present

## 2023-05-28 DIAGNOSIS — Z419 Encounter for procedure for purposes other than remedying health state, unspecified: Secondary | ICD-10-CM | POA: Diagnosis not present

## 2023-07-28 DIAGNOSIS — Z419 Encounter for procedure for purposes other than remedying health state, unspecified: Secondary | ICD-10-CM | POA: Diagnosis not present

## 2023-08-28 DIAGNOSIS — Z419 Encounter for procedure for purposes other than remedying health state, unspecified: Secondary | ICD-10-CM | POA: Diagnosis not present

## 2023-09-28 DIAGNOSIS — Z419 Encounter for procedure for purposes other than remedying health state, unspecified: Secondary | ICD-10-CM | POA: Diagnosis not present

## 2023-10-26 DIAGNOSIS — Z419 Encounter for procedure for purposes other than remedying health state, unspecified: Secondary | ICD-10-CM | POA: Diagnosis not present

## 2023-12-07 DIAGNOSIS — Z419 Encounter for procedure for purposes other than remedying health state, unspecified: Secondary | ICD-10-CM | POA: Diagnosis not present

## 2024-01-06 DIAGNOSIS — Z419 Encounter for procedure for purposes other than remedying health state, unspecified: Secondary | ICD-10-CM | POA: Diagnosis not present

## 2024-02-06 DIAGNOSIS — Z419 Encounter for procedure for purposes other than remedying health state, unspecified: Secondary | ICD-10-CM | POA: Diagnosis not present

## 2024-03-07 DIAGNOSIS — Z419 Encounter for procedure for purposes other than remedying health state, unspecified: Secondary | ICD-10-CM | POA: Diagnosis not present

## 2024-04-07 DIAGNOSIS — Z419 Encounter for procedure for purposes other than remedying health state, unspecified: Secondary | ICD-10-CM | POA: Diagnosis not present

## 2024-05-08 DIAGNOSIS — Z419 Encounter for procedure for purposes other than remedying health state, unspecified: Secondary | ICD-10-CM | POA: Diagnosis not present

## 2024-06-07 DIAGNOSIS — Z419 Encounter for procedure for purposes other than remedying health state, unspecified: Secondary | ICD-10-CM | POA: Diagnosis not present

## 2024-08-07 DIAGNOSIS — Z419 Encounter for procedure for purposes other than remedying health state, unspecified: Secondary | ICD-10-CM | POA: Diagnosis not present
